# Patient Record
Sex: Male | Born: 1945 | Race: White | Hispanic: No | Marital: Married | Smoking: Never smoker
Health system: Southern US, Community
[De-identification: ages and names within clinical notes are randomized; demographics above are authoritative.]

## PROBLEM LIST (undated history)

## (undated) DIAGNOSIS — I951 Orthostatic hypotension: Secondary | ICD-10-CM

## (undated) DIAGNOSIS — M25519 Pain in unspecified shoulder: Secondary | ICD-10-CM

## (undated) DIAGNOSIS — R3129 Other microscopic hematuria: Secondary | ICD-10-CM

## (undated) DIAGNOSIS — M6200 Separation of muscle (nontraumatic), unspecified site: Secondary | ICD-10-CM

## (undated) DIAGNOSIS — L409 Psoriasis, unspecified: Secondary | ICD-10-CM

## (undated) DIAGNOSIS — R972 Elevated prostate specific antigen [PSA]: Secondary | ICD-10-CM

## (undated) DIAGNOSIS — I493 Ventricular premature depolarization: Secondary | ICD-10-CM

## (undated) DIAGNOSIS — E785 Hyperlipidemia, unspecified: Secondary | ICD-10-CM

## (undated) DIAGNOSIS — I10 Essential (primary) hypertension: Secondary | ICD-10-CM

## (undated) DIAGNOSIS — L719 Rosacea, unspecified: Secondary | ICD-10-CM

## (undated) DIAGNOSIS — E669 Obesity, unspecified: Secondary | ICD-10-CM

## (undated) DIAGNOSIS — N419 Inflammatory disease of prostate, unspecified: Secondary | ICD-10-CM

## (undated) DIAGNOSIS — I517 Cardiomegaly: Secondary | ICD-10-CM

## (undated) DIAGNOSIS — M109 Gout, unspecified: Secondary | ICD-10-CM

## (undated) DIAGNOSIS — R339 Retention of urine, unspecified: Secondary | ICD-10-CM

## (undated) DIAGNOSIS — N400 Enlarged prostate without lower urinary tract symptoms: Secondary | ICD-10-CM

## (undated) DIAGNOSIS — E782 Mixed hyperlipidemia: Secondary | ICD-10-CM

## (undated) DIAGNOSIS — N2889 Other specified disorders of kidney and ureter: Secondary | ICD-10-CM

## (undated) DIAGNOSIS — N138 Other obstructive and reflux uropathy: Secondary | ICD-10-CM

## (undated) HISTORY — DX: Separation of muscle (nontraumatic), unspecified site: M62.00

## (undated) HISTORY — DX: Other microscopic hematuria: R31.29

## (undated) HISTORY — DX: Pain in unspecified shoulder: M25.519

## (undated) HISTORY — DX: Inflammatory disease of prostate, unspecified: N41.9

## (undated) HISTORY — PX: OTHER SURGICAL HISTORY: SHX169

## (undated) HISTORY — DX: Rosacea, unspecified: L71.9

## (undated) HISTORY — DX: Benign prostatic hyperplasia without lower urinary tract symptoms: N40.0

## (undated) HISTORY — DX: Essential (primary) hypertension: I10

## (undated) HISTORY — DX: Obesity, unspecified: E66.9

## (undated) HISTORY — PX: COLONOSCOPY WITH PROPOFOL: SHX5780

## (undated) HISTORY — DX: Retention of urine, unspecified: R33.9

## (undated) HISTORY — DX: Hyperlipidemia, unspecified: E78.5

## (undated) HISTORY — DX: Other specified disorders of kidney and ureter: N28.89

## (undated) HISTORY — DX: Gout, unspecified: M10.9

## (undated) HISTORY — DX: Psoriasis, unspecified: L40.9

## (undated) HISTORY — PX: HYDROCELE EXCISION: SHX482

---

## 2011-02-27 DIAGNOSIS — M25519 Pain in unspecified shoulder: Secondary | ICD-10-CM | POA: Insufficient documentation

## 2011-02-27 DIAGNOSIS — L408 Other psoriasis: Secondary | ICD-10-CM | POA: Insufficient documentation

## 2011-02-27 DIAGNOSIS — M109 Gout, unspecified: Secondary | ICD-10-CM | POA: Insufficient documentation

## 2011-02-27 DIAGNOSIS — N419 Inflammatory disease of prostate, unspecified: Secondary | ICD-10-CM | POA: Insufficient documentation

## 2011-02-27 DIAGNOSIS — M6200 Separation of muscle (nontraumatic), unspecified site: Secondary | ICD-10-CM | POA: Insufficient documentation

## 2011-02-27 DIAGNOSIS — E782 Mixed hyperlipidemia: Secondary | ICD-10-CM | POA: Insufficient documentation

## 2011-02-27 DIAGNOSIS — L719 Rosacea, unspecified: Secondary | ICD-10-CM | POA: Insufficient documentation

## 2011-02-27 DIAGNOSIS — I10 Essential (primary) hypertension: Secondary | ICD-10-CM | POA: Insufficient documentation

## 2013-01-30 ENCOUNTER — Ambulatory Visit: Payer: Self-pay | Admitting: Gastroenterology

## 2013-02-02 LAB — PATHOLOGY REPORT

## 2013-08-06 HISTORY — PX: COLONOSCOPY WITH PROPOFOL: SHX5780

## 2013-11-04 ENCOUNTER — Ambulatory Visit: Payer: Self-pay | Admitting: Family Medicine

## 2014-11-16 ENCOUNTER — Emergency Department
Admit: 2014-11-16 | Disposition: A | Discharge status: Home / Self Care | Payer: Self-pay | Admitting: Emergency Medicine

## 2014-11-16 LAB — URINALYSIS, COMPLETE
Bilirubin,UR: NEGATIVE
Glucose,UR: NEGATIVE mg/dL (ref 0–75)
KETONE: NEGATIVE
Nitrite: NEGATIVE
PH: 6 (ref 4.5–8.0)
PROTEIN: NEGATIVE
SPECIFIC GRAVITY: 1.01 (ref 1.003–1.030)

## 2015-06-23 ENCOUNTER — Encounter: Payer: Self-pay | Admitting: *Deleted

## 2015-07-04 ENCOUNTER — Encounter: Payer: Self-pay | Admitting: Urology

## 2015-07-04 ENCOUNTER — Ambulatory Visit (INDEPENDENT_AMBULATORY_CARE_PROVIDER_SITE_OTHER): Payer: Medicare Other | Admitting: Urology

## 2015-07-04 VITALS — BP 129/67 | HR 52 | Ht 69.0 in | Wt 238.5 lb

## 2015-07-04 DIAGNOSIS — N401 Enlarged prostate with lower urinary tract symptoms: Secondary | ICD-10-CM

## 2015-07-04 DIAGNOSIS — Z87898 Personal history of other specified conditions: Secondary | ICD-10-CM

## 2015-07-04 DIAGNOSIS — N400 Enlarged prostate without lower urinary tract symptoms: Secondary | ICD-10-CM | POA: Insufficient documentation

## 2015-07-04 DIAGNOSIS — N289 Disorder of kidney and ureter, unspecified: Secondary | ICD-10-CM | POA: Diagnosis not present

## 2015-07-04 DIAGNOSIS — R972 Elevated prostate specific antigen [PSA]: Secondary | ICD-10-CM

## 2015-07-04 DIAGNOSIS — N138 Other obstructive and reflux uropathy: Secondary | ICD-10-CM

## 2015-07-04 DIAGNOSIS — Z87448 Personal history of other diseases of urinary system: Secondary | ICD-10-CM | POA: Diagnosis not present

## 2015-07-04 LAB — URINALYSIS, COMPLETE
Bilirubin, UA: NEGATIVE
Glucose, UA: NEGATIVE
Ketones, UA: NEGATIVE
LEUKOCYTES UA: NEGATIVE
NITRITE UA: NEGATIVE
Protein, UA: NEGATIVE
RBC, UA: NEGATIVE
Specific Gravity, UA: 1.015 (ref 1.005–1.030)
Urobilinogen, Ur: 0.2 mg/dL (ref 0.2–1.0)
pH, UA: 6.5 (ref 5.0–7.5)

## 2015-07-04 LAB — MICROSCOPIC EXAMINATION: EPITHELIAL CELLS (NON RENAL): NONE SEEN /HPF (ref 0–10)

## 2015-07-04 NOTE — Progress Notes (Signed)
07/04/2015 9:09 AM   Eric Dickson. 1946/04/24 [id number]  Referring provider: No referring provider defined for this encounter.  Chief Complaint  Patient presents with  . Benign Prostatic Hypertrophy    6 month recheck    HPI: Patient is a 69 year old white male with a history of indeterminate renal masses, a history of acute urinary retention, elevated PSA and BPH with LUTS who presents today for 6 month follow-up.  Renal masses Patient was found to have a left benign renal cyst and a right renal lesion that was too small to characterize on an MRI performed June 2015.  History of acute urinary retention Patient had an episode of acute urinary retention precipitated by a UTI in April 2016.  He had been off his alpha blocker and finasteride for several years prior to his event. He has not any difficulty with urination since reinstating these medications.  Elevated PSA Patient's PSA was 6.0 on 12/27/2014. His PSA, free was 2.30. He was reinstated on his finasteride and he presents today for follow-up. PSA is drawn today.  BPH WITH LUTS His IPSS score today is 8, which is moderate lower urinary tract symptomatology. He is pleased with his quality life due to his urinary symptoms.  He denies any dysuria, hematuria or suprapubic pain.   He currently taking toxin cells and 4 mg daily and finasteride 5 mg daily.   He also denies any recent fevers, chills, nausea or vomiting.   He does not have a family history of PCa.      IPSS      07/04/15 0900       International Prostate Symptom Score   How often have you had the sensation of not emptying your bladder? Less than 1 in 5     How often have you had to urinate less than every two hours? Less than 1 in 5 times     How often have you found you stopped and started again several times when you urinated? Less than half the time     How often have you found it difficult to postpone urination? Not at All     How often have you  had a weak urinary stream? Less than 1 in 5 times     How often have you had to strain to start urination? Less than 1 in 5 times     How many times did you typically get up at night to urinate? 2 Times     Total IPSS Score 8     Quality of Life due to urinary symptoms   If you were to spend the rest of your life with your urinary condition just the way it is now how would you feel about that? Pleased        Score:  1-7 Mild 8-19 Moderate 20-35 Severe   PMH: Past Medical History  Diagnosis Date  . BPH (benign prostatic hypertrophy)   . Gout   . Shoulder pain   . Diastasis, muscle   . Psoriasis   . Microscopic hematuria   . Renal mass   . HLD (hyperlipidemia)   . Rosacea   . Prostatitis   . Urinary retention   . HTN (hypertension)   . Obesity     Surgical History: Past Surgical History  Procedure Laterality Date  . Hydrostent removal      Home Medications:    Medication List       This list is accurate as of: 07/04/15  9:09 AM.  Always use your most recent med list.               amLODipine 10 MG tablet  Commonly known as:  NORVASC  Take by mouth.     aspirin 81 MG tablet  Take by mouth.     clindamycin 1 % gel  Commonly known as:  CLINDAGEL  Apply topically.     doxazosin 4 MG tablet  Commonly known as:  CARDURA  TK 1 T PO ATN     finasteride 5 MG tablet  Commonly known as:  PROSCAR  Take by mouth.     fluticasone 50 MCG/ACT nasal spray  Commonly known as:  FLONASE  Place into the nose.     lisinopril 20 MG tablet  Commonly known as:  PRINIVIL,ZESTRIL  Take by mouth.     magnesium 30 MG tablet  Take 30 mg by mouth daily.     metoprolol 50 MG tablet  Commonly known as:  LOPRESSOR  Take by mouth.     MULTIVITAMIN ADULT PO  Take by mouth.     nystatin cream  Commonly known as:  MYCOSTATIN  Apply topically.     PROBIOTIC + OMEGA-3 PO  Take by mouth.     triamterene-hydrochlorothiazide 37.5-25 MG tablet  Commonly known as:   MAXZIDE-25  TK 1 T PO QD        Allergies: No Known Allergies  Family History: Family History  Problem Relation Age of Onset  . COPD Mother   . Heart disease Father   . Kidney disease Neg Hx   . Prostate cancer Neg Hx     Social History:  reports that he has never smoked. He does not have any smokeless tobacco history on file. He reports that he does not drink alcohol or use illicit drugs.  ROS: UROLOGY Frequent Urination?: No Hard to postpone urination?: No Burning/pain with urination?: No Get up at night to urinate?: Yes Leakage of urine?: No Urine stream starts and stops?: No Trouble starting stream?: No Do you have to strain to urinate?: No Blood in urine?: No Urinary tract infection?: No Sexually transmitted disease?: No Injury to kidneys or bladder?: No Painful intercourse?: No Weak stream?: No Erection problems?: No Penile pain?: No  Gastrointestinal Nausea?: No Vomiting?: No Indigestion/heartburn?: No Diarrhea?: No Constipation?: No  Constitutional Fever: No Night sweats?: No Weight loss?: No Fatigue?: No  Skin Skin rash/lesions?: No Itching?: Yes  Eyes Blurred vision?: No Double vision?: No  Ears/Nose/Throat Sore throat?: No Sinus problems?: No  Hematologic/Lymphatic Swollen glands?: No Easy bruising?: No  Cardiovascular Leg swelling?: No Chest pain?: No  Respiratory Cough?: No Shortness of breath?: No  Endocrine Excessive thirst?: No  Musculoskeletal Back pain?: No Joint pain?: No  Neurological Headaches?: No Dizziness?: No  Psychologic Depression?: No Anxiety?: No  Physical Exam: BP 129/67 mmHg  Pulse 52  Ht 5\' 9"  (1.753 m)  Wt 238 lb 8 oz (108.183 kg)  BMI 35.20 kg/m2  GU: No CVA tenderness.  No bladder fullness or masses.  Patient with circumcised phallus.  Urethral meatus is patent.  No penile discharge. No penile lesions or rashes. Scrotum without lesions, cysts, rashes and/or edema.  Testicles are located  scrotally bilaterally. No masses are appreciated in the testicles. Left and right epididymis are normal. Rectal: Patient with  normal sphincter tone. Anus and perineum without scarring or rashes. No rectal masses are appreciated. Prostate is approximately 55 grams, no nodules are appreciated. Seminal vesicles  are normal.   Laboratory Data: PSA history  6.0 ng/mL on 12/27/2014  PSA, free 2.30 ng/mL  Urinalysis Results for orders placed or performed in visit on 07/04/15  Microscopic Examination  Result Value Ref Range   WBC, UA 0-5 0 -  5 /hpf   RBC, UA 0-2 0 -  2 /hpf   Epithelial Cells (non renal) None seen 0 - 10 /hpf   Renal Epithel, UA 0-10 (A) None seen /hpf   Bacteria, UA Few None seen/Few  Urinalysis, Complete  Result Value Ref Range   Specific Gravity, UA 1.015 1.005 - 1.030   pH, UA 6.5 5.0 - 7.5   Color, UA Yellow Yellow   Appearance Ur Clear Clear   Leukocytes, UA Negative Negative   Protein, UA Negative Negative/Trace   Glucose, UA Negative Negative   Ketones, UA Negative Negative   RBC, UA Negative Negative   Bilirubin, UA Negative Negative   Urobilinogen, Ur 0.2 0.2 - 1.0 mg/dL   Nitrite, UA Negative Negative   Microscopic Examination See below:      Assessment & Plan:    1. Renal mass:   Patient with a too small to characterize right renal mass found on MRI performed 1 year ago.   I will schedule a renal ultrasound and contact him with the results by phone.   2. History of acute urinary retention:    Patient has had no further difficulty with urination once he was reinstated on his Cardura and Proscar.   He will continue these medications.   3. Elevated PSA:   Patient's PSA was found to be 6.0 ng/mL in May 2016.  Free, PSA indicated a 10% probability of prostate cancer.   He was initiated on finasteride 6 months ago.   PSA is drawn today and hopefully the value has declined.   4. BPH (benign prostatic hyperplasia) with LUTS:   IPSS score 8/1.  He will  continue the Cardura and Proscar. His primary care physician is filling the prescriptions. He'll return in 6 months for I PSS score, exam and PSA  - Urinalysis, Complete - PSA   Return in about 6 months (around 01/01/2016) for IPSS score and exam.  Zara Council, [facility]  [place] [address] [place], [place] [address] [phone]

## 2015-07-05 ENCOUNTER — Telehealth: Payer: Self-pay

## 2015-07-05 DIAGNOSIS — R972 Elevated prostate specific antigen [PSA]: Secondary | ICD-10-CM

## 2015-07-05 LAB — PSA: Prostate Specific Ag, Serum: 1.5 ng/mL (ref 0.0–4.0)

## 2015-07-05 NOTE — Telephone Encounter (Signed)
Spoke with pt in reference to PSA results. Pt voiced understanding.  

## 2015-07-05 NOTE — Telephone Encounter (Signed)
-----   Message from Nori Riis, PA-C sent at 07/05/2015  8:31 AM EST ----- Patient's PSA is stable.  We will see him in 12 months.  PSA to be drawn before his next appointment.

## 2015-07-12 ENCOUNTER — Ambulatory Visit
Admission: RE | Admit: 2015-07-12 | Discharge: 2015-07-12 | Disposition: A | Discharge status: Home / Self Care | Payer: Medicare Other | Source: Ambulatory Visit | Attending: Urology | Admitting: Urology

## 2015-07-12 DIAGNOSIS — R938 Abnormal findings on diagnostic imaging of other specified body structures: Secondary | ICD-10-CM | POA: Diagnosis not present

## 2015-07-12 DIAGNOSIS — N289 Disorder of kidney and ureter, unspecified: Secondary | ICD-10-CM | POA: Diagnosis present

## 2015-07-13 ENCOUNTER — Telehealth: Payer: Self-pay

## 2015-07-13 DIAGNOSIS — N289 Disorder of kidney and ureter, unspecified: Secondary | ICD-10-CM

## 2015-07-13 DIAGNOSIS — N2889 Other specified disorders of kidney and ureter: Secondary | ICD-10-CM

## 2015-07-13 NOTE — Telephone Encounter (Signed)
-----   Message from Nori Riis, PA-C sent at 07/12/2015  4:54 PM EST ----- You can tell the patient that his RUS was normal.  We will repeat it again in one year.

## 2015-07-13 NOTE — Telephone Encounter (Signed)
Spoke with pt in reference to RUS. Pt voiced understanding. Orders placed.

## 2015-12-07 ENCOUNTER — Other Ambulatory Visit: Payer: Self-pay | Admitting: Urology

## 2015-12-08 ENCOUNTER — Other Ambulatory Visit: Payer: Self-pay | Admitting: Urology

## 2015-12-27 ENCOUNTER — Other Ambulatory Visit: Payer: Medicare Other

## 2015-12-27 DIAGNOSIS — R972 Elevated prostate specific antigen [PSA]: Secondary | ICD-10-CM

## 2015-12-28 LAB — PSA: PROSTATE SPECIFIC AG, SERUM: 1 ng/mL (ref 0.0–4.0)

## 2016-01-03 ENCOUNTER — Encounter: Payer: Self-pay | Admitting: Urology

## 2016-01-03 ENCOUNTER — Ambulatory Visit (INDEPENDENT_AMBULATORY_CARE_PROVIDER_SITE_OTHER): Payer: Medicare Other | Admitting: Urology

## 2016-01-03 VITALS — BP 119/67 | HR 55 | Ht 69.0 in | Wt 240.8 lb

## 2016-01-03 DIAGNOSIS — N2889 Other specified disorders of kidney and ureter: Secondary | ICD-10-CM

## 2016-01-03 DIAGNOSIS — Z87448 Personal history of other diseases of urinary system: Secondary | ICD-10-CM

## 2016-01-03 DIAGNOSIS — Z87898 Personal history of other specified conditions: Secondary | ICD-10-CM | POA: Diagnosis not present

## 2016-01-03 DIAGNOSIS — N138 Other obstructive and reflux uropathy: Secondary | ICD-10-CM

## 2016-01-03 DIAGNOSIS — N401 Enlarged prostate with lower urinary tract symptoms: Secondary | ICD-10-CM | POA: Diagnosis not present

## 2016-01-03 MED ORDER — FINASTERIDE 5 MG PO TABS: 5.0000 mg | ORAL_TABLET | Freq: Every day | ORAL | Status: DC

## 2016-01-03 NOTE — Progress Notes (Signed)
11:24 AM   Eric Dickson. 1946-05-21 [id number]  Referring provider: [name], MD [address] [place], [place] [address]  Chief Complaint  Patient presents with  . Benign Prostatic Hypertrophy    6 mos follow up   . Hematuria    microscopic    HPI: Patient is a 70 year old Caucasian male with indeterminate renal masses, a history of acute urinary retention, a history of elevated PSA and BPH with LUTS who presents today for 6 month follow-up.  Renal masses Patient was found to have a left benign renal cyst and a right renal lesion that was too small to characterize on an MRI performed June 2015.  RUS completed in 07/12/2015 did not identify a discrete mass.    History of acute urinary retention Patient had an episode of acute urinary retention precipitated by a UTI in April 2016.  He had been off his alpha blocker and finasteride for several years prior to his event. He has not any difficulty with urination since reinstating these medications.    History of elevated PSA Patient's PSA was 6.0 on 12/27/2014. His PSA, free was 2.30. He was reinstated on his finasteride.   His most recent PSA was 1.0 ng/mL on 12/27/2015.    BPH WITH LUTS His IPSS score today is 7, which is mild lower urinary tract symptomatology. He is delighted with his quality life due to his urinary symptoms.  His previous IPSS score is 8/1.   He denies any dysuria, hematuria or suprapubic pain.   He currently taking doxazosin 4 mg daily and finasteride 5 mg daily.   He also denies any recent fevers, chills, nausea or vomiting.   He does not have a family history of PCa.      IPSS      01/03/16 0900       International Prostate Symptom Score   How often have you had the sensation of not emptying your bladder? Less than 1 in 5     How often have you had to urinate less than every two hours? About half the time     How often have you found you stopped and started again several times  when you urinated? Not at All     How often have you found it difficult to postpone urination? Not at All     How often have you had a weak urinary stream? Less than 1 in 5 times     How often have you had to strain to start urination? Less than 1 in 5 times     How many times did you typically get up at night to urinate? 1 Time     Total IPSS Score 7     Quality of Life due to urinary symptoms   If you were to spend the rest of your life with your urinary condition just the way it is now how would you feel about that? Delighted        Score:  1-7 Mild 8-19 Moderate 20-35 Severe   PMH: Past Medical History  Diagnosis Date  . BPH (benign prostatic hypertrophy)   . Gout   . Shoulder pain   . Diastasis, muscle   . Psoriasis   . Microscopic hematuria   . Renal mass   . HLD (hyperlipidemia)   . Rosacea   . Prostatitis   . Urinary retention   . HTN (hypertension)   . Obesity     Surgical History: Past  Surgical History  Procedure Laterality Date  . Hydrostent removal      Home Medications:    Medication List       This list is accurate as of: 01/03/16 11:24 AM.  Always use your most recent med list.               amLODipine 10 MG tablet  Commonly known as:  NORVASC  Take by mouth.     aspirin 81 MG tablet  Take by mouth.     atorvastatin 20 MG tablet  Commonly known as:  LIPITOR  Take by mouth.     clindamycin 1 % gel  Commonly known as:  CLINDAGEL  Apply topically.     doxazosin 4 MG tablet  Commonly known as:  CARDURA  TK 1 T PO ATN     finasteride 5 MG tablet  Commonly known as:  PROSCAR  Take 1 tablet (5 mg total) by mouth daily.     FISH OIL + D3 1000-1000 MG-UNIT Caps  Take by mouth.     fluticasone 50 MCG/ACT nasal spray  Commonly known as:  FLONASE  Place into the nose. Reported on 01/03/2016     lisinopril 20 MG tablet  Commonly known as:  PRINIVIL,ZESTRIL  Take by mouth.     magnesium 30 MG tablet  Take 30 mg by mouth daily.       magnesium oxide 400 MG tablet  Commonly known as:  MAG-OX  Take by mouth. Reported on 01/03/2016     metoprolol 50 MG tablet  Commonly known as:  LOPRESSOR  Take by mouth.     MULTIVITAMIN ADULT PO  Take by mouth.     PROBIOTIC + OMEGA-3 PO  Take by mouth. Reported on 01/03/2016     triamterene-hydrochlorothiazide 37.5-25 MG tablet  Commonly known as:  MAXZIDE-25  TK 1 T PO QD        Allergies: No Known Allergies  Family History: Family History  Problem Relation Age of Onset  . COPD Mother   . Heart disease Father   . Kidney disease Neg Hx   . Prostate cancer Neg Hx     Social History:  reports that he has never smoked. He does not have any smokeless tobacco history on file. He reports that he does not drink alcohol or use illicit drugs.  ROS: UROLOGY Frequent Urination?: No Hard to postpone urination?: No Burning/pain with urination?: No Get up at night to urinate?: No Leakage of urine?: No Urine stream starts and stops?: No Trouble starting stream?: No Do you have to strain to urinate?: No Blood in urine?: No Urinary tract infection?: No Sexually transmitted disease?: No Injury to kidneys or bladder?: No Painful intercourse?: No Weak stream?: No Erection problems?: No Penile pain?: No  Gastrointestinal Nausea?: No Vomiting?: No Indigestion/heartburn?: No Diarrhea?: No Constipation?: No  Constitutional Fever: No Night sweats?: No Weight loss?: No Fatigue?: No  Skin Skin rash/lesions?: No Itching?: No  Eyes Blurred vision?: No Double vision?: No  Ears/Nose/Throat Sore throat?: No Sinus problems?: No  Hematologic/Lymphatic Swollen glands?: No Easy bruising?: No  Cardiovascular Leg swelling?: No Chest pain?: No  Respiratory Cough?: No Shortness of breath?: No  Endocrine Excessive thirst?: No  Musculoskeletal Back pain?: No Joint pain?: No  Neurological Headaches?: No Dizziness?: No  Psychologic Depression?:  No Anxiety?: No  Physical Exam: BP 119/67 mmHg  Pulse 55  Ht 5\' 9"  (1.753 m)  Wt 240 lb 12.8 oz (109.226 kg)  BMI  35.54 kg/m2  GU: No CVA tenderness.  No bladder fullness or masses.  Patient with circumcised phallus.  Urethral meatus is patent.  No penile discharge. No penile lesions or rashes. Scrotum without lesions, cysts, rashes and/or edema.  Testicles are located scrotally bilaterally. No masses are appreciated in the testicles. Left and right epididymis are normal. Rectal: Patient with  normal sphincter tone. Anus and perineum without scarring or rashes. No rectal masses are appreciated. Prostate is approximately 55 grams, no nodules are appreciated. Seminal vesicles are normal.   Laboratory Data: PSA history  6.0 ng/mL on 12/27/2014  PSA, free 2.30 ng/mL  1.5 ng/mL on 07/04/2015  1.0 ng/mL on 12/27/2015      Assessment & Plan:    1. Renal mass:   Patient with a too small to characterize right renal mass found on MRI performed 1 year ago.   I will schedule a renal ultrasound and contact him with the results by phone.   2. History of acute urinary retention:    Patient has had no further difficulty with urination once he was reinstated on his Cardura and Proscar.   He will discontinue the Cardura at this time and continue the finasteride 5 mg daily.  3. History of elevated PSA:   Patient's PSA was found to be 6.0 ng/mL in May 2016.  Free, PSA indicated a 10% probability of prostate cancer.   He was initiated on finasteride 12 months ago.   Recent PSA was 1.0 ng/mL on 12/27/2015.  We will see him in one year for IPSS score, exam and PSA.    4. BPH (benign prostatic hyperplasia) with LUTS:   IPSS score 7/0.  He will discontinue the Cardura and continue the Proscar.   He'll return in 12 months for I PSS score, exam and PSA   Return in about 1 year (around 01/02/2017) for IPSS, PSA and exam.  Zara Council, [facility]  [place] [address] [place],  [address] [phone]

## 2016-01-23 ENCOUNTER — Ambulatory Visit
Admission: RE | Admit: 2016-01-23 | Discharge: 2016-01-23 | Disposition: A | Discharge status: Home / Self Care | Payer: Medicare Other | Source: Ambulatory Visit | Attending: Urology | Admitting: Urology

## 2016-01-23 DIAGNOSIS — K760 Fatty (change of) liver, not elsewhere classified: Secondary | ICD-10-CM | POA: Diagnosis not present

## 2016-01-23 DIAGNOSIS — N2889 Other specified disorders of kidney and ureter: Secondary | ICD-10-CM | POA: Insufficient documentation

## 2016-01-26 ENCOUNTER — Other Ambulatory Visit: Payer: Self-pay | Admitting: Urology

## 2016-01-26 DIAGNOSIS — N2889 Other specified disorders of kidney and ureter: Secondary | ICD-10-CM

## 2016-01-27 ENCOUNTER — Telehealth: Payer: Self-pay | Admitting: Urology

## 2016-01-27 NOTE — Telephone Encounter (Signed)
Ok I will take care of it. I have made the follow up appt and I have left him a message to call me back   michelle

## 2016-01-27 NOTE — Telephone Encounter (Signed)
Are you going to call him with the results?

## 2016-01-27 NOTE — Telephone Encounter (Signed)
I would like him to have the appointment in 6 months in case the RUS results show bad pathology.  If I receive the results before his appointment and it is benign, I will most likely call him with the results.

## 2016-01-27 NOTE — Telephone Encounter (Signed)
Patient notified of this appt.

## 2016-04-03 ENCOUNTER — Encounter: Payer: Self-pay | Admitting: Urology

## 2016-04-03 ENCOUNTER — Ambulatory Visit (INDEPENDENT_AMBULATORY_CARE_PROVIDER_SITE_OTHER): Payer: Medicare Other | Admitting: Urology

## 2016-04-03 VITALS — BP 167/69 | HR 60 | Ht 69.0 in | Wt 239.2 lb

## 2016-04-03 DIAGNOSIS — N400 Enlarged prostate without lower urinary tract symptoms: Secondary | ICD-10-CM

## 2016-04-03 DIAGNOSIS — R339 Retention of urine, unspecified: Secondary | ICD-10-CM

## 2016-04-03 LAB — MICROSCOPIC EXAMINATION: BACTERIA UA: NONE SEEN

## 2016-04-03 LAB — URINALYSIS, COMPLETE
BILIRUBIN UA: NEGATIVE
Glucose, UA: NEGATIVE
Ketones, UA: NEGATIVE
Leukocytes, UA: NEGATIVE
Nitrite, UA: NEGATIVE
PH UA: 7 (ref 5.0–7.5)
Protein, UA: NEGATIVE
Specific Gravity, UA: 1.02 (ref 1.005–1.030)
UUROB: 0.2 mg/dL (ref 0.2–1.0)

## 2016-04-03 LAB — BLADDER SCAN AMB NON-IMAGING: Scan Result: 105

## 2016-04-03 MED ORDER — TAMSULOSIN HCL 0.4 MG PO CAPS: 0.4000 mg | ORAL_CAPSULE | Freq: Every day | ORAL | 11 refills | Status: DC

## 2016-04-03 NOTE — Progress Notes (Signed)
04/03/2016 11:41 AM   Eric Dickson. 06-05-1946 [id number]  Referring provider: [name], MD [address] [place], [place] [address]  Chief Complaint  Patient presents with  . Other    Trouble Urinating    HPI: Eric Dickson is a 70yo here for worsening LUTS. He feels that as the day progresses he has difficulty with weak stream and incomplete emptying for the past 2 weeks. Intermittent dysuria. Nocturia 1x.  No other associated symptoms. No He had a hx of UTI with urinary retention 1 year ago. He is currently on finasteride 5mg  daily. He was previously on Cardura but stopped that medication over 1 year ago.  IPSS is 7 with a bother of 4.  PVR is 105cc.  He has a daily morning bowel movement which is mostly soft.   PMH: Past Medical History:  Diagnosis Date  . BPH (benign prostatic hypertrophy)   . Diastasis, muscle   . Gout   . HLD (hyperlipidemia)   . HTN (hypertension)   . Microscopic hematuria   . Obesity   . Prostatitis   . Psoriasis   . Renal mass   . Rosacea   . Shoulder pain   . Urinary retention     Surgical History: Past Surgical History:  Procedure Laterality Date  . hydrostent removal      Home Medications:    Medication List       Accurate as of 04/03/16 11:41 AM. Always use your most recent med list.          amLODipine 10 MG tablet Commonly known as:  NORVASC Take by mouth.   aspirin 81 MG tablet Take by mouth.   atorvastatin 20 MG tablet Commonly known as:  LIPITOR Take by mouth.   clindamycin 1 % gel Commonly known as:  CLINDAGEL Apply topically.   doxazosin 4 MG tablet Commonly known as:  CARDURA TK 1 T PO ATN   finasteride 5 MG tablet Commonly known as:  PROSCAR Take 1 tablet (5 mg total) by mouth daily.   FISH OIL + D3 1000-1000 MG-UNIT Caps Take by mouth.   fluticasone 50 MCG/ACT nasal spray Commonly known as:  FLONASE Place into the nose. Reported on 01/03/2016   lisinopril 20 MG  tablet Commonly known as:  PRINIVIL,ZESTRIL Take by mouth.   magnesium 30 MG tablet Take 30 mg by mouth daily.   magnesium oxide 400 MG tablet Commonly known as:  MAG-OX Take by mouth. Reported on 01/03/2016   metoprolol 50 MG tablet Commonly known as:  LOPRESSOR Take by mouth.   MULTIVITAMIN ADULT PO Take by mouth.   PROBIOTIC + OMEGA-3 PO Take by mouth. Reported on 01/03/2016   triamterene-hydrochlorothiazide 37.5-25 MG tablet Commonly known as:  MAXZIDE-25 TK 1 T PO QD       Allergies: No Known Allergies  Family History: Family History  Problem Relation Age of Onset  . COPD Mother   . Heart disease Father   . Kidney disease Neg Hx   . Prostate cancer Neg Hx     Social History:  reports that he has never smoked. He does not have any smokeless tobacco history on file. He reports that he does not drink alcohol or use drugs.  ROS: UROLOGY Frequent Urination?: No Hard to postpone urination?: No Burning/pain with urination?: Yes Get up at night to urinate?: No Leakage of urine?: No Urine stream starts and stops?: Yes Trouble starting stream?: No Do you have to strain to  urinate?: Yes Blood in urine?: No Urinary tract infection?: No Sexually transmitted disease?: No Injury to kidneys or bladder?: No Painful intercourse?: No Weak stream?: No Erection problems?: No Penile pain?: No  Gastrointestinal Nausea?: No Vomiting?: No Indigestion/heartburn?: Yes Diarrhea?: No Constipation?: No  Constitutional Fever: No Night sweats?: No Weight loss?: No Fatigue?: No  Skin Skin rash/lesions?: No Itching?: No  Eyes Blurred vision?: No Double vision?: No  Ears/Nose/Throat Sore throat?: No Sinus problems?: No  Hematologic/Lymphatic Swollen glands?: No Easy bruising?: No  Cardiovascular Leg swelling?: No Chest pain?: No  Respiratory Cough?: No Shortness of breath?: No  Endocrine Excessive thirst?: No  Musculoskeletal Back pain?: Yes Joint  pain?: No  Neurological Headaches?: No Dizziness?: No  Psychologic Depression?: No Anxiety?: No  Physical Exam: BP (!) 167/69   Pulse 60   Ht 5\' 9"  (1.753 m)   Wt 108.5 kg (239 lb 3.2 oz)   BMI 35.32 kg/m   Constitutional:  Alert and oriented, No acute distress. HEENT: [place] AT, moist mucus membranes.  Trachea midline, no masses. Cardiovascular: No clubbing, cyanosis, or edema. Respiratory: Normal respiratory effort, no increased work of breathing. GI: Abdomen is soft, nontender, nondistended, no abdominal masses GU: No CVA tenderness. Uncircumcised phallus. No masses/lesions on penis, scrotum, testes. Prostate 80g smooth, no nodules, no induration. Normal rectal tone. Skin: No rashes, bruises or suspicious lesions. Lymph: No cervical or inguinal adenopathy. Neurologic: Grossly intact, no focal deficits, moving all 4 extremities. Psychiatric: Normal mood and affect.  Laboratory Data: No results found for: WBC, HGB, HCT, MCV, PLT  No results found for: CREATININE  No results found for: PSA  No results found for: TESTOSTERONE  No results found for: HGBA1C  Urinalysis    Component Value Date/Time   COLORURINE YELLOW 11/16/2014 1530   APPEARANCEUR Clear 07/04/2015 0848   LABSPEC 1.010 11/16/2014 1530   PHURINE 6.0 11/16/2014 1530   GLUCOSEU Negative 07/04/2015 0848   GLUCOSEU NEGATIVE 11/16/2014 1530   HGBUR TRACE 11/16/2014 1530   BILIRUBINUR Negative 07/04/2015 0848   BILIRUBINUR NEGATIVE 11/16/2014 [place] 11/16/2014 1530   PROTEINUR Negative 07/04/2015 0848   PROTEINUR NEGATIVE 11/16/2014 1530   NITRITE Negative 07/04/2015 0848   NITRITE NEGATIVE 11/16/2014 1530   LEUKOCYTESUR Negative 07/04/2015 0848   LEUKOCYTESUR 3+ 11/16/2014 1530    Pertinent Imaging: none  Assessment & Plan:    1. Incomplete bladder emptying -flomax 0.4mg  daily -continue finasteride 5mg   - CULTURE, URINE COMPREHENSIVE - BLADDER SCAN AMB NON-IMAGING -  Urinalysis, Complete   No Follow-up on file.  Nicolette Bang, MD  [facility] [address], [place], [place] [address] [phone]

## 2016-05-08 ENCOUNTER — Ambulatory Visit (INDEPENDENT_AMBULATORY_CARE_PROVIDER_SITE_OTHER): Payer: Medicare Other | Admitting: Urology

## 2016-05-08 ENCOUNTER — Encounter: Payer: Self-pay | Admitting: Urology

## 2016-05-08 VITALS — BP 135/69 | HR 50 | Ht 69.0 in | Wt 239.3 lb

## 2016-05-08 DIAGNOSIS — R339 Retention of urine, unspecified: Secondary | ICD-10-CM | POA: Diagnosis not present

## 2016-05-08 DIAGNOSIS — N400 Enlarged prostate without lower urinary tract symptoms: Secondary | ICD-10-CM | POA: Diagnosis not present

## 2016-05-08 DIAGNOSIS — N2889 Other specified disorders of kidney and ureter: Secondary | ICD-10-CM | POA: Diagnosis not present

## 2016-05-08 LAB — BLADDER SCAN AMB NON-IMAGING: Scan Result: 39

## 2016-05-08 NOTE — Progress Notes (Signed)
05/08/2016 9:20 AM   Eric Dickson. 1946/04/09 [id number]  Referring provider: [name], MD [address] [place], [place] [address]  Chief Complaint  Patient presents with  . Follow-up    BPH,     HPI: Eric Dickson is a 70yo seen today for followup for BPH and ledt renal mass. He was previously on Cardura and finasteride but the cardura was stopped in 12/2015. He was then started on flomax which he relieves has significantly improved his nocturia 0-1x, frequency q 4-5 hours.   He underwent Renal US in 01/2016 which shiwed a 2.7cm left lower pole renal mass that is stable compared renal US 6 months prior   PMH: Past Medical History:  Diagnosis Date  . BPH (benign prostatic hypertrophy)   . Diastasis, muscle   . Gout   . HLD (hyperlipidemia)   . HTN (hypertension)   . Microscopic hematuria   . Obesity   . Prostatitis   . Psoriasis   . Renal mass   . Rosacea   . Shoulder pain   . Urinary retention     Surgical History: Past Surgical History:  Procedure Laterality Date  . hydrostent removal      Home Medications:    Medication List       Accurate as of 05/08/16  9:20 AM. Always use your most recent med list.          amLODipine 10 MG tablet Commonly known as:  NORVASC Take by mouth.   amLODipine 10 MG tablet Commonly known as:  NORVASC Take by mouth.   aspirin 81 MG tablet Take by mouth.   atorvastatin 20 MG tablet Commonly known as:  LIPITOR Take by mouth.   doxazosin 4 MG tablet Commonly known as:  CARDURA TK 1 T PO ATN   finasteride 5 MG tablet Commonly known as:  PROSCAR Take 1 tablet (5 mg total) by mouth daily.   FISH OIL + D3 1000-1000 MG-UNIT Caps Take by mouth.   fluticasone 50 MCG/ACT nasal spray Commonly known as:  FLONASE Place into the nose. Reported on 01/03/2016   lisinopril 20 MG tablet Commonly known as:  PRINIVIL,ZESTRIL Take by mouth.   lisinopril 20 MG tablet Commonly known as:   PRINIVIL,ZESTRIL Take by mouth.   magnesium 30 MG tablet Take 30 mg by mouth daily.   magnesium oxide 400 MG tablet Commonly known as:  MAG-OX Take by mouth. Reported on 01/03/2016   metoprolol 50 MG tablet Commonly known as:  LOPRESSOR Take by mouth.   metoprolol 50 MG tablet Commonly known as:  LOPRESSOR Take by mouth.   MULTIVITAMIN ADULT PO Take by mouth.   PROBIOTIC + OMEGA-3 PO Take by mouth. Reported on 01/03/2016   tamsulosin 0.4 MG Caps capsule Commonly known as:  FLOMAX Take 1 capsule (0.4 mg total) by mouth daily after supper.   triamterene-hydrochlorothiazide 37.5-25 MG tablet Commonly known as:  MAXZIDE-25 TK 1 T PO QD       Allergies: No Known Allergies  Family History: Family History  Problem Relation Age of Onset  . COPD Mother   . Heart disease Father   . Kidney disease Neg Hx   . Prostate cancer Neg Hx     Social History:  reports that he has never smoked. He does not have any smokeless tobacco history on file. He reports that he does not drink alcohol or use drugs.  ROS: UROLOGY Frequent Urination?: No Hard to postpone urination?: No Burning/pain  with urination?: No Get up at night to urinate?: No Leakage of urine?: No Urine stream starts and stops?: Yes Trouble starting stream?: No Do you have to strain to urinate?: No Blood in urine?: No Urinary tract infection?: No Sexually transmitted disease?: No Injury to kidneys or bladder?: No Painful intercourse?: No Weak stream?: No Erection problems?: No Penile pain?: No  Gastrointestinal Nausea?: No Vomiting?: No Indigestion/heartburn?: No Diarrhea?: No Constipation?: No  Constitutional Fever: No Night sweats?: No Weight loss?: No Fatigue?: No  Skin Skin rash/lesions?: No Itching?: No  Eyes Blurred vision?: No Double vision?: No  Ears/Nose/Throat Sore throat?: No Sinus problems?: No  Hematologic/Lymphatic Swollen glands?: No Easy bruising?:  No  Cardiovascular Leg swelling?: No Chest pain?: No  Respiratory Cough?: No Shortness of breath?: No  Endocrine Excessive thirst?: No  Musculoskeletal Back pain?: No Joint pain?: No  Neurological Headaches?: No Dizziness?: No  Psychologic Depression?: No Anxiety?: No  Physical Exam: BP 135/69   Pulse (!) 50   Ht 5\' 9"  (1.753 m)   Wt 108.5 kg (239 lb 4.8 oz)   BMI 35.34 kg/m   Constitutional:  Alert and oriented, No acute distress. HEENT: [place] AT, moist mucus membranes.  Trachea midline, no masses. Cardiovascular: No clubbing, cyanosis, or edema. Respiratory: Normal respiratory effort, no increased work of breathing. GI: Abdomen is soft, nontender, nondistended, no abdominal masses GU: No CVA tenderness.  Skin: No rashes, bruises or suspicious lesions. Lymph: No cervical or inguinal adenopathy. Neurologic: Grossly intact, no focal deficits, moving all 4 extremities. Psychiatric: Normal mood and affect.  Laboratory Data: No results found for: WBC, HGB, HCT, MCV, PLT  No results found for: CREATININE  No results found for: PSA  No results found for: TESTOSTERONE  No results found for: HGBA1C  Urinalysis    Component Value Date/Time   COLORURINE YELLOW 11/16/2014 1530   APPEARANCEUR Clear 04/03/2016 1139   LABSPEC 1.010 11/16/2014 1530   PHURINE 6.0 11/16/2014 1530   GLUCOSEU Negative 04/03/2016 1139   GLUCOSEU NEGATIVE 11/16/2014 1530   HGBUR TRACE 11/16/2014 1530   BILIRUBINUR Negative 04/03/2016 1139   BILIRUBINUR NEGATIVE 11/16/2014 [place] 11/16/2014 1530   PROTEINUR Negative 04/03/2016 [place] 11/16/2014 1530   NITRITE Negative 04/03/2016 1139   NITRITE NEGATIVE 11/16/2014 1530   LEUKOCYTESUR Negative 04/03/2016 1139   LEUKOCYTESUR 3+ 11/16/2014 1530    Pertinent Imaging: Renal US  Assessment & Plan:    1. Benign prostatic hyperplasia without lower urinary tract symptoms -continue finasteride and  flomax - Bladder Scan (Post Void Residual) in office -RTC 6 months   2. Incomplete bladder emptying -PVR today was good - Bladder Scan (Post Void Residual) in office  3. Renal mass -RTC 6 months with renal US   No Follow-up on file.  Nicolette Bang, MD  [facility] [address], [place], [place] [address] [phone]

## 2016-07-20 ENCOUNTER — Ambulatory Visit: Payer: Medicare Other | Admitting: Urology

## 2016-07-23 ENCOUNTER — Ambulatory Visit
Admission: RE | Admit: 2016-07-23 | Discharge: 2016-07-23 | Disposition: A | Discharge status: Home / Self Care | Payer: Medicare Other | Source: Ambulatory Visit | Attending: Urology | Admitting: Urology

## 2016-07-23 DIAGNOSIS — N2889 Other specified disorders of kidney and ureter: Secondary | ICD-10-CM | POA: Insufficient documentation

## 2016-07-24 ENCOUNTER — Telehealth: Payer: Self-pay

## 2016-07-24 DIAGNOSIS — N2889 Other specified disorders of kidney and ureter: Secondary | ICD-10-CM

## 2016-07-24 NOTE — Telephone Encounter (Signed)
-----   Message from Nori Riis, PA-C sent at 07/23/2016  1:08 PM EST ----- Please notify the patient that the area in the left kidney has not changed significantly.  He may undergo a MRI of his kidneys for further clarification or continue with RUS every six months for surveillance.

## 2016-07-24 NOTE — Telephone Encounter (Signed)
Spoke with pt in reference to RUS results. Made pt aware can have a MRI or continue RUS. Pt elected to continue RUS. Orders placed.

## 2016-10-03 ENCOUNTER — Other Ambulatory Visit: Payer: Self-pay | Admitting: Urology

## 2016-10-03 DIAGNOSIS — N401 Enlarged prostate with lower urinary tract symptoms: Secondary | ICD-10-CM

## 2016-10-30 DIAGNOSIS — N401 Enlarged prostate with lower urinary tract symptoms: Secondary | ICD-10-CM | POA: Insufficient documentation

## 2016-10-30 DIAGNOSIS — R351 Nocturia: Secondary | ICD-10-CM

## 2016-10-30 DIAGNOSIS — L708 Other acne: Secondary | ICD-10-CM | POA: Insufficient documentation

## 2016-10-30 DIAGNOSIS — Z23 Encounter for immunization: Secondary | ICD-10-CM | POA: Insufficient documentation

## 2016-12-26 ENCOUNTER — Other Ambulatory Visit: Payer: Medicare Other

## 2017-01-01 ENCOUNTER — Other Ambulatory Visit: Payer: Medicare Other

## 2017-01-02 ENCOUNTER — Other Ambulatory Visit
Admission: RE | Admit: 2017-01-02 | Discharge: 2017-01-02 | Disposition: A | Discharge status: Home / Self Care | Payer: Medicare Other | Source: Ambulatory Visit | Attending: Urology | Admitting: Urology

## 2017-01-02 ENCOUNTER — Other Ambulatory Visit: Payer: Self-pay | Admitting: *Deleted

## 2017-01-02 ENCOUNTER — Ambulatory Visit: Payer: Medicare Other | Admitting: Urology

## 2017-01-02 DIAGNOSIS — R972 Elevated prostate specific antigen [PSA]: Secondary | ICD-10-CM | POA: Diagnosis present

## 2017-01-02 NOTE — Progress Notes (Signed)
01/04/2017 11:32 AM   Eric Dickson. 10-05-1945 [id number]  Referring provider: [name], [place], [place] [address]  Chief Complaint  Patient presents with  . Benign Prostatic Hypertrophy    1 year follow up     HPI: 71 yo WM with BPH with LU TS and left renal mass who presents for a yearly visit.  BPH WITH LUTS His IPSS score today is 11, which is moderate lower urinary tract symptomatology. He is mostly satisfied with his quality life due to his urinary symptoms. His PVR is 93 mL.  His major complaint today is nocturia x 1.  He has had these symptoms forever.   He states that over the last year, his nocturia has reduced from 3 to 1 times a night.  He denies any dysuria, hematuria or suprapubic pain.   He currently taking tamsulosin 0.4 mg daily and finasteride 5 mg daily.    He also denies any recent fevers, chills, nausea or vomiting.     IPSS    Row Name 01/04/17 1100         International Prostate Symptom Score   How often have you had the sensation of not emptying your bladder? Less than half the time     How often have you had to urinate less than every two hours? About half the time     How often have you found you stopped and started again several times when you urinated? Less than half the time     How often have you found it difficult to postpone urination? Less than 1 in 5 times     How often have you had a weak urinary stream? Less than 1 in 5 times     How often have you had to strain to start urination? Less than 1 in 5 times     How many times did you typically get up at night to urinate? 1 Time     Total IPSS Score 11       Quality of Life due to urinary symptoms   If you were to spend the rest of your life with your urinary condition just the way it is now how would you feel about that? Mostly Satisfied        Score:  1-7 Mild 8-19 Moderate 20-35 Severe   Renal mass RUS performed on 07/23/2016 noted a  rounded mass versus prominent dromedary hump measuring 2.4 x 1.5 x 2.2 cm in the left kidney.  He has not had any flank pain or hematuria.     PMH: Past Medical History:  Diagnosis Date  . BPH (benign prostatic hypertrophy)   . Diastasis, muscle   . Gout   . HLD (hyperlipidemia)   . HTN (hypertension)   . Microscopic hematuria   . Obesity   . Prostatitis   . Psoriasis   . Renal mass   . Rosacea   . Shoulder pain   . Urinary retention     Surgical History: Past Surgical History:  Procedure Laterality Date  . hydrostent removal      Home Medications:  Allergies as of 01/04/2017   No Known Allergies     Medication List       Accurate as of 01/04/17 11:32 AM. Always use your most recent med list.          amLODipine 10 MG tablet Commonly known as:  NORVASC Take by mouth.   amLODipine 10 MG  tablet Commonly known as:  NORVASC Take by mouth.   aspirin 81 MG tablet Take by mouth.   atorvastatin 20 MG tablet Commonly known as:  LIPITOR Take by mouth.   clindamycin 1 % gel Commonly known as:  CLINDAGEL Apply topically.   doxazosin 4 MG tablet Commonly known as:  CARDURA TK 1 T PO ATN   finasteride 5 MG tablet Commonly known as:  PROSCAR TAKE (1) TABLET BY MOUTH EVERY DAY   FISH OIL + D3 1000-1000 MG-UNIT Caps Take by mouth.   fluticasone 50 MCG/ACT nasal spray Commonly known as:  FLONASE Place into the nose. Reported on 01/03/2016   lisinopril 20 MG tablet Commonly known as:  PRINIVIL,ZESTRIL Take by mouth.   lisinopril 20 MG tablet Commonly known as:  PRINIVIL,ZESTRIL Take by mouth.   magnesium 30 MG tablet Take 30 mg by mouth daily.   magnesium oxide 400 MG tablet Commonly known as:  MAG-OX Take by mouth. Reported on 01/03/2016   metoprolol tartrate 50 MG tablet Commonly known as:  LOPRESSOR Take by mouth.   metoprolol tartrate 50 MG tablet Commonly known as:  LOPRESSOR Take by mouth.   MULTIVITAMIN ADULT PO Take by mouth.     MULTIVITAMIN PO Take by mouth.   PROBIOTIC + OMEGA-3 PO Take by mouth. Reported on 01/03/2016   tamsulosin 0.4 MG Caps capsule Commonly known as:  FLOMAX Take 1 capsule (0.4 mg total) by mouth daily after supper.   triamterene-hydrochlorothiazide 37.5-25 MG tablet Commonly known as:  MAXZIDE-25 TK 1 T PO QD   Zoster Vac Recomb Adjuvanted injection Commonly known as:  SHINGRIX 1 dose today, repeat 4-6 months       Allergies: No Known Allergies  Family History: Family History  Problem Relation Age of Onset  . COPD Mother   . Heart disease Father   . Kidney disease Neg Hx   . Prostate cancer Neg Hx   . Kidney cancer Neg Hx   . Bladder Cancer Neg Hx     Social History:  reports that he has never smoked. He quit smokeless tobacco use about 25 years ago. His smokeless tobacco use included Chew. He reports that he does not drink alcohol or use drugs.  ROS: UROLOGY Frequent Urination?: No Hard to postpone urination?: No Burning/pain with urination?: No Get up at night to urinate?: Yes Leakage of urine?: No Urine stream starts and stops?: No Trouble starting stream?: No Do you have to strain to urinate?: No Blood in urine?: No Urinary tract infection?: No Sexually transmitted disease?: No Injury to kidneys or bladder?: No Painful intercourse?: No Weak stream?: No Erection problems?: No Penile pain?: No  Gastrointestinal Nausea?: No Vomiting?: No Indigestion/heartburn?: No Diarrhea?: No Constipation?: No  Constitutional Fever: No Night sweats?: No Weight loss?: No Fatigue?: No  Skin Skin rash/lesions?: No Itching?: No  Eyes Blurred vision?: No Double vision?: No  Ears/Nose/Throat Sore throat?: No Sinus problems?: No  Hematologic/Lymphatic Swollen glands?: No Easy bruising?: No  Cardiovascular Leg swelling?: No Chest pain?: No  Respiratory Cough?: No Shortness of breath?: No  Endocrine Excessive thirst?: No  Musculoskeletal Back  pain?: Yes Joint pain?: No  Neurological Headaches?: No Dizziness?: No  Psychologic Depression?: No Anxiety?: No  Physical Exam: BP (!) 147/69   Pulse (!) 52   Ht 5\' 9"  (1.753 m)   Wt 209 lb (94.8 kg)   BMI 30.86 kg/m   Constitutional: Well nourished. Alert and oriented, No acute distress. HEENT: [place] AT, moist mucus membranes.  Trachea midline, no masses. Cardiovascular: No clubbing, cyanosis, or edema. Respiratory: Normal respiratory effort, no increased work of breathing. GI: Abdomen is soft, non tender, non distended, no abdominal masses. Liver and spleen not palpable.  No hernias appreciated.  Stool sample for occult testing is not indicated.   GU: No CVA tenderness.  No bladder fullness or masses.  Patient with circumcised phallus.  Urethral meatus is patent.  No penile discharge. No penile lesions or rashes. Scrotum without lesions, cysts, rashes and/or edema.  Testicles are located scrotally bilaterally. No masses are appreciated in the testicles. Left and right epididymis are normal. Rectal: Patient with  normal sphincter tone. Anus and perineum without scarring or rashes. No rectal masses are appreciated. Prostate is approximately 45 grams, no nodules are appreciated. Seminal vesicles are normal. Skin: No rashes, bruises or suspicious lesions. Lymph: No cervical or inguinal adenopathy. Neurologic: Grossly intact, no focal deficits, moving all 4 extremities. Psychiatric: Normal mood and affect.  Laboratory Data: PSA History    1.5 ng/mL on 07/04/2015  1.0 ng/mL on 12/27/2015  0.79 ng/mL on 01/02/2017   Assessment & Plan:    1. BPH with LUTS  - IPSS score is 11/2  - Continue conservative management, avoiding bladder irritants and timed voiding's  - most bothersome symptoms is/are nocturia  - Continue tamsulosin 0.4 mg daily and finasteride 5 mg daily; refills given  - RTC in 12 months for IPSS, PSA and exam   2. Left renal mass  - unchanged at 07/2016 RUS  -  will schedule follow up RUS at this time - call with results    Return in about 1 year (around 01/04/2018) for  IPSS, PSA , RUS and exam.  Zara Council, [facility]  [facility] [address] [place],  [address] [phone]

## 2017-01-03 LAB — PSA: PSA: 0.79 ng/mL (ref 0.00–4.00)

## 2017-01-04 ENCOUNTER — Encounter: Payer: Self-pay | Admitting: Urology

## 2017-01-04 ENCOUNTER — Ambulatory Visit (INDEPENDENT_AMBULATORY_CARE_PROVIDER_SITE_OTHER): Payer: Medicare Other | Admitting: Urology

## 2017-01-04 VITALS — BP 147/69 | HR 52 | Ht 69.0 in | Wt 209.0 lb

## 2017-01-04 DIAGNOSIS — N138 Other obstructive and reflux uropathy: Secondary | ICD-10-CM | POA: Diagnosis not present

## 2017-01-04 DIAGNOSIS — N401 Enlarged prostate with lower urinary tract symptoms: Secondary | ICD-10-CM | POA: Diagnosis not present

## 2017-01-04 DIAGNOSIS — N2889 Other specified disorders of kidney and ureter: Secondary | ICD-10-CM | POA: Diagnosis not present

## 2017-01-04 LAB — BLADDER SCAN AMB NON-IMAGING: Scan Result: 93

## 2017-01-04 MED ORDER — FINASTERIDE 5 MG PO TABS: ORAL_TABLET | ORAL | 1 refills | Status: DC

## 2017-01-04 MED ORDER — TAMSULOSIN HCL 0.4 MG PO CAPS: 0.4000 mg | ORAL_CAPSULE | Freq: Every day | ORAL | 3 refills | Status: DC

## 2017-01-14 ENCOUNTER — Ambulatory Visit
Admission: RE | Admit: 2017-01-14 | Discharge: 2017-01-14 | Disposition: A | Discharge status: Home / Self Care | Payer: Medicare Other | Source: Ambulatory Visit | Attending: Urology | Admitting: Urology

## 2017-01-14 DIAGNOSIS — N2889 Other specified disorders of kidney and ureter: Secondary | ICD-10-CM | POA: Insufficient documentation

## 2017-01-21 ENCOUNTER — Ambulatory Visit: Payer: Medicare Other

## 2017-01-21 ENCOUNTER — Telehealth: Payer: Self-pay

## 2017-01-21 NOTE — Telephone Encounter (Signed)
Called patient. Gave RUS results. Patient verbalized understanding.

## 2017-01-21 NOTE — Telephone Encounter (Signed)
-----   Message from Nori Riis, PA-C sent at 01/21/2017  7:58 AM EDT ----- Please let Mr. Stfleur know that his RUS was normal.  We do not need to continue the ultrasounds at this time.

## 2017-05-14 DIAGNOSIS — I517 Cardiomegaly: Secondary | ICD-10-CM | POA: Insufficient documentation

## 2017-12-25 ENCOUNTER — Other Ambulatory Visit: Payer: Self-pay

## 2017-12-25 ENCOUNTER — Other Ambulatory Visit: Payer: Self-pay | Admitting: Urology

## 2017-12-25 DIAGNOSIS — N401 Enlarged prostate with lower urinary tract symptoms: Secondary | ICD-10-CM

## 2017-12-25 MED ORDER — FINASTERIDE 5 MG PO TABS: ORAL_TABLET | ORAL | 0 refills | Status: DC

## 2017-12-27 NOTE — Telephone Encounter (Signed)
Patient called the office today about his Rx for finasteride.  It was sent to the wrong pharmacy.  He would like the Rx for FINASTERIDE and TAMSULOSIN to be sent to the [facility] in [place], not [facility].  Please contact him with any questions or concerns.

## 2018-01-02 NOTE — Progress Notes (Deleted)
01/03/2018 9:04 AM   Eric Dickson. [birth year] [id number]  Referring provider: [name], [place], [place] [address]  No chief complaint on file.   HPI: 72 yo WM with BPH with LU TS and left renal mass who presents for a yearly visit.  BPH WITH LUTS His IPSS score today is ***, which is *** lower urinary tract symptomatology. He is *** with his quality life due to his urinary symptoms. His PVR is 93 mL.  His previous I PSS score was 11/2.  His previous PVR was 93 mL.  His major complaint today *** is nocturia x 1.  He has had these symptoms forever.   He states that over the last year, his nocturia has reduced from 3 to 1 times a night.  He denies any dysuria, hematuria or suprapubic pain.   He currently taking tamsulosin 0.4 mg daily and finasteride 5 mg daily.    He also denies any recent fevers, chills, nausea or vomiting.   Score:  1-7 Mild 8-19 Moderate 20-35 Severe   Renal mass RUS performed on 07/23/2016 noted a rounded mass versus prominent dromedary hump measuring 2.4 x 1.5 x 2.2 cm in the left kidney.   RUS on 01/14/2017 noted no left renal mass is observed. The kidneys are normal in appearance. No acute abnormality of the urinary bladder is demonstrated.     PMH: Past Medical History:  Diagnosis Date  . BPH (benign prostatic hypertrophy)   . Diastasis, muscle   . Gout   . HLD (hyperlipidemia)   . HTN (hypertension)   . Microscopic hematuria   . Obesity   . Prostatitis   . Psoriasis   . Renal mass   . Rosacea   . Shoulder pain   . Urinary retention     Surgical History: Past Surgical History:  Procedure Laterality Date  . hydrostent removal      Home Medications:  Allergies as of 01/03/2018   No Known Allergies     Medication List        Accurate as of 01/02/18  9:04 AM. Always use your most recent med list.          amLODipine 10 MG tablet Commonly known as:  NORVASC Take by mouth.   amLODipine 10  MG tablet Commonly known as:  NORVASC Take by mouth.   aspirin 81 MG tablet Take by mouth.   atorvastatin 20 MG tablet Commonly known as:  LIPITOR Take by mouth.   doxazosin 4 MG tablet Commonly known as:  CARDURA TK 1 T PO ATN   finasteride 5 MG tablet Commonly known as:  PROSCAR TAKE (1) TABLET BY MOUTH EVERY DAY   finasteride 5 MG tablet Commonly known as:  PROSCAR TAKE (1) TABLET BY MOUTH EVERY DAY   FISH OIL + D3 1000-1000 MG-UNIT Caps Take by mouth.   fluticasone 50 MCG/ACT nasal spray Commonly known as:  FLONASE Place into the nose. Reported on 01/03/2016   lisinopril 20 MG tablet Commonly known as:  PRINIVIL,ZESTRIL Take by mouth.   lisinopril 20 MG tablet Commonly known as:  PRINIVIL,ZESTRIL Take by mouth.   magnesium 30 MG tablet Take 30 mg by mouth daily.   magnesium oxide 400 MG tablet Commonly known as:  MAG-OX Take by mouth. Reported on 01/03/2016   metoprolol tartrate 50 MG tablet Commonly known as:  LOPRESSOR Take by mouth.   metoprolol tartrate 50 MG tablet Commonly known as:  LOPRESSOR Take  by mouth.   MULTIVITAMIN ADULT PO Take by mouth.   MULTIVITAMIN PO Take by mouth.   PROBIOTIC + OMEGA-3 PO Take by mouth. Reported on 01/03/2016   tamsulosin 0.4 MG Caps capsule Commonly known as:  FLOMAX Take 1 capsule (0.4 mg total) by mouth daily after supper.   triamterene-hydrochlorothiazide 37.5-25 MG tablet Commonly known as:  MAXZIDE-25 TK 1 T PO QD   Zoster Vaccine Adjuvanted injection Commonly known as:  SHINGRIX 1 dose today, repeat 4-6 months       Allergies: No Known Allergies  Family History: Family History  Problem Relation Age of Onset  . COPD Mother   . Heart disease Father   . Kidney disease Neg Hx   . Prostate cancer Neg Hx   . Kidney cancer Neg Hx   . Bladder Cancer Neg Hx     Social History:  reports that he has never smoked. He quit smokeless tobacco use about 26 years ago. His smokeless tobacco use  included chew. He reports that he does not drink alcohol or use drugs.  ROS:                                        Physical Exam: There were no vitals taken for this visit.  Constitutional: Well nourished. Alert and oriented, No acute distress. HEENT: [place] AT, moist mucus membranes. Trachea midline, no masses. Cardiovascular: No clubbing, cyanosis, or edema. Respiratory: Normal respiratory effort, no increased work of breathing. GI: Abdomen is soft, non tender, non distended, no abdominal masses. Liver and spleen not palpable.  No hernias appreciated.  Stool sample for occult testing is not indicated.   GU: No CVA tenderness.  No bladder fullness or masses.  Patient with circumcised phallus.  Urethral meatus is patent.  No penile discharge. No penile lesions or rashes. Scrotum without lesions, cysts, rashes and/or edema.  Testicles are located scrotally bilaterally. No masses are appreciated in the testicles. Left and right epididymis are normal. Rectal: Patient with  normal sphincter tone. Anus and perineum without scarring or rashes. No rectal masses are appreciated. Prostate is approximately 45 grams, no nodules are appreciated. Seminal vesicles are normal. Skin: No rashes, bruises or suspicious lesions. Lymph: No cervical or inguinal adenopathy. Neurologic: Grossly intact, no focal deficits, moving all 4 extremities. Psychiatric: Normal mood and affect. ***  Constitutional: Well nourished. Alert and oriented, No acute distress. HEENT: [place] AT, moist mucus membranes. Trachea midline, no masses. Cardiovascular: No clubbing, cyanosis, or edema. Respiratory: Normal respiratory effort, no increased work of breathing. GI: Abdomen is soft, non tender, non distended, no abdominal masses. Liver and spleen not palpable.  No hernias appreciated.  Stool sample for occult testing is not indicated.   GU: No CVA tenderness.  No bladder fullness or masses.  Patient with  circumcised/uncircumcised phallus. ***Foreskin easily retracted***  Urethral meatus is patent.  No penile discharge. No penile lesions or rashes. Scrotum without lesions, cysts, rashes and/or edema.  Testicles are located scrotally bilaterally. No masses are appreciated in the testicles. Left and right epididymis are normal. Rectal: Patient with  normal sphincter tone. Anus and perineum without scarring or rashes. No rectal masses are appreciated. Prostate is approximately *** grams, *** nodules are appreciated. Seminal vesicles are normal. Skin: No rashes, bruises or suspicious lesions. Lymph: No cervical or inguinal adenopathy. Neurologic: Grossly intact, no focal deficits, moving all 4 extremities. Psychiatric: Normal mood  and affect.   Laboratory Data: PSA History    1.5 ng/mL on 07/04/2015  1.0 ng/mL on 12/27/2015  0.79 ng/mL on 01/02/2017  I have reviewed the labs.  Pertinent Imaging CLINICAL DATA:  Follow-up left renal mass versus dromedary hump  EXAM: RENAL / URINARY TRACT ULTRASOUND COMPLETE  COMPARISON:  Ultrasound of July 23, 2016  FINDINGS: Right Kidney:  Length: 12.8 cm. Echogenicity within normal limits. No mass or hydronephrosis visualized.  Left Kidney:  Length: 13.3 cm. The renal cortical echotexture is normal. No suspicious mass is observed. There is no hydronephrosis.  Bladder:  Appears normal for degree of bladder distention. Bilateral ureteral jets are demonstrated. There is mild enlargement of the prostate gland to 5.2 cm transversely.  IMPRESSION: No left renal mass is observed. The kidneys are normal in appearance. No acute abnormality of the urinary bladder is demonstrated.   Electronically Signed   By: David  Martinique M.D.   On: 01/14/2017 10:05   Assessment & Plan:    1. BPH with LUTS  - IPSS score is 11/2  - Continue conservative management, avoiding bladder irritants and timed voiding's  - most bothersome symptoms  is/are nocturia  - Continue tamsulosin 0.4 mg daily and finasteride 5 mg daily; refills given  - RTC in 12 months for IPSS, PSA and exam - if today's PSA is stable   2. Left renal mass No masses seen on RUS in 01/2017   No follow-ups on file.  Eric Council, PA-C  [facility] [address] [place], [place] [address] [phone]

## 2018-01-03 ENCOUNTER — Ambulatory Visit: Payer: Medicare Other | Admitting: Urology

## 2018-04-29 ENCOUNTER — Emergency Department: Payer: Medicare Other

## 2018-04-29 ENCOUNTER — Encounter: Payer: Self-pay | Admitting: Emergency Medicine

## 2018-04-29 ENCOUNTER — Observation Stay
Admission: EM | Admit: 2018-04-29 | Discharge: 2018-04-30 | Disposition: A | Discharge status: Home / Self Care | Payer: Medicare Other | Attending: Internal Medicine | Admitting: Internal Medicine

## 2018-04-29 ENCOUNTER — Other Ambulatory Visit: Payer: Self-pay

## 2018-04-29 DIAGNOSIS — E860 Dehydration: Secondary | ICD-10-CM | POA: Insufficient documentation

## 2018-04-29 DIAGNOSIS — Z23 Encounter for immunization: Secondary | ICD-10-CM | POA: Insufficient documentation

## 2018-04-29 DIAGNOSIS — I10 Essential (primary) hypertension: Secondary | ICD-10-CM | POA: Insufficient documentation

## 2018-04-29 DIAGNOSIS — Z825 Family history of asthma and other chronic lower respiratory diseases: Secondary | ICD-10-CM | POA: Diagnosis not present

## 2018-04-29 DIAGNOSIS — Z8249 Family history of ischemic heart disease and other diseases of the circulatory system: Secondary | ICD-10-CM | POA: Diagnosis not present

## 2018-04-29 DIAGNOSIS — R338 Other retention of urine: Secondary | ICD-10-CM | POA: Insufficient documentation

## 2018-04-29 DIAGNOSIS — R778 Other specified abnormalities of plasma proteins: Secondary | ICD-10-CM

## 2018-04-29 DIAGNOSIS — Z6831 Body mass index (BMI) 31.0-31.9, adult: Secondary | ICD-10-CM | POA: Insufficient documentation

## 2018-04-29 DIAGNOSIS — N179 Acute kidney failure, unspecified: Secondary | ICD-10-CM | POA: Insufficient documentation

## 2018-04-29 DIAGNOSIS — E785 Hyperlipidemia, unspecified: Secondary | ICD-10-CM | POA: Insufficient documentation

## 2018-04-29 DIAGNOSIS — M6200 Separation of muscle (nontraumatic), unspecified site: Secondary | ICD-10-CM | POA: Insufficient documentation

## 2018-04-29 DIAGNOSIS — Z87891 Personal history of nicotine dependence: Secondary | ICD-10-CM | POA: Insufficient documentation

## 2018-04-29 DIAGNOSIS — E669 Obesity, unspecified: Secondary | ICD-10-CM | POA: Diagnosis not present

## 2018-04-29 DIAGNOSIS — N401 Enlarged prostate with lower urinary tract symptoms: Secondary | ICD-10-CM | POA: Insufficient documentation

## 2018-04-29 DIAGNOSIS — M109 Gout, unspecified: Secondary | ICD-10-CM | POA: Insufficient documentation

## 2018-04-29 DIAGNOSIS — L719 Rosacea, unspecified: Secondary | ICD-10-CM | POA: Diagnosis not present

## 2018-04-29 DIAGNOSIS — R55 Syncope and collapse: Principal | ICD-10-CM | POA: Diagnosis present

## 2018-04-29 DIAGNOSIS — I491 Atrial premature depolarization: Secondary | ICD-10-CM | POA: Diagnosis not present

## 2018-04-29 DIAGNOSIS — R3914 Feeling of incomplete bladder emptying: Secondary | ICD-10-CM | POA: Diagnosis not present

## 2018-04-29 DIAGNOSIS — E119 Type 2 diabetes mellitus without complications: Secondary | ICD-10-CM | POA: Diagnosis not present

## 2018-04-29 DIAGNOSIS — R748 Abnormal levels of other serum enzymes: Secondary | ICD-10-CM | POA: Insufficient documentation

## 2018-04-29 DIAGNOSIS — Z79899 Other long term (current) drug therapy: Secondary | ICD-10-CM | POA: Insufficient documentation

## 2018-04-29 DIAGNOSIS — N419 Inflammatory disease of prostate, unspecified: Secondary | ICD-10-CM | POA: Insufficient documentation

## 2018-04-29 DIAGNOSIS — R7989 Other specified abnormal findings of blood chemistry: Secondary | ICD-10-CM

## 2018-04-29 LAB — BASIC METABOLIC PANEL
Anion gap: 9 (ref 5–15)
BUN: 28 mg/dL — ABNORMAL HIGH (ref 8–23)
CO2: 23 mmol/L (ref 22–32)
Calcium: 9.1 mg/dL (ref 8.9–10.3)
Chloride: 105 mmol/L (ref 98–111)
Creatinine, Ser: 1.23 mg/dL (ref 0.61–1.24)
GFR calc Af Amer: >60 mL/min (ref >60)
GFR, EST NON AFRICAN AMERICAN: 57 mL/min — AB (ref >60)
GLUCOSE: 120 mg/dL — AB (ref 70–99)
POTASSIUM: 4 mmol/L (ref 3.5–5.1)
Sodium: 137 mmol/L (ref 135–145)

## 2018-04-29 LAB — URINALYSIS, COMPLETE (UACMP) WITH MICROSCOPIC
BACTERIA UA: NONE SEEN
BILIRUBIN URINE: NEGATIVE
Glucose, UA: NEGATIVE mg/dL
Hgb urine dipstick: NEGATIVE
KETONES UR: 20 mg/dL — AB
LEUKOCYTES UA: NEGATIVE
Nitrite: NEGATIVE
Protein, ur: NEGATIVE mg/dL
Specific Gravity, Urine: 1.018 (ref 1.005–1.030)
Squamous Epithelial / LPF: NONE SEEN (ref 0–5)
pH: 5 (ref 5.0–8.0)

## 2018-04-29 LAB — CBC
HEMATOCRIT: 44.7 % (ref 40.0–52.0)
HEMOGLOBIN: 15.5 g/dL (ref 13.0–18.0)
MCH: 29.4 pg (ref 26.0–34.0)
MCHC: 34.6 g/dL (ref 32.0–36.0)
MCV: 84.9 fL (ref 80.0–100.0)
Platelets: 282 10*3/uL (ref 150–440)
RBC: 5.27 MIL/uL (ref 4.40–5.90)
RDW: 13.4 % (ref 11.5–14.5)
WBC: 10.4 10*3/uL (ref 3.8–10.6)

## 2018-04-29 LAB — TROPONIN I
TROPONIN I: 0.09 ng/mL — AB (ref <0.03)
Troponin I: 0.08 ng/mL (ref <0.03)

## 2018-04-29 MED ORDER — ONDANSETRON HCL 4 MG PO TABS: 4.0000 mg | ORAL_TABLET | Freq: Four times a day (QID) | ORAL | Status: DC | PRN

## 2018-04-29 MED ORDER — INFLUENZA VAC SPLIT HIGH-DOSE 0.5 ML IM SUSY
0.5000 mL | PREFILLED_SYRINGE | INTRAMUSCULAR | Status: DC
  Filled 2018-04-29: qty 0.5

## 2018-04-29 MED ORDER — ACETAMINOPHEN 650 MG RE SUPP: 650.0000 mg | Freq: Four times a day (QID) | RECTAL | Status: DC | PRN

## 2018-04-29 MED ORDER — AMLODIPINE BESYLATE 10 MG PO TABS
10.0000 mg | ORAL_TABLET | Freq: Every day | ORAL | Status: DC
  Administered 2018-04-30: 10 mg via ORAL
  Filled 2018-04-29: qty 1

## 2018-04-29 MED ORDER — ONDANSETRON HCL 4 MG/2ML IJ SOLN: 4.0000 mg | Freq: Four times a day (QID) | INTRAMUSCULAR | Status: DC | PRN

## 2018-04-29 MED ORDER — FISH OIL + D3 1000-1000 MG-UNIT PO CAPS: 1.0000 | ORAL_CAPSULE | Freq: Every day | ORAL | Status: DC

## 2018-04-29 MED ORDER — SODIUM CHLORIDE 0.9 % IV BOLUS
1000.0000 mL | Freq: Once | INTRAVENOUS | Status: AC
  Administered 2018-04-29: 1000 mL via INTRAVENOUS

## 2018-04-29 MED ORDER — ASPIRIN 81 MG PO CHEW
CHEWABLE_TABLET | ORAL | Status: AC
  Filled 2018-04-29: qty 4

## 2018-04-29 MED ORDER — FAMOTIDINE 20 MG PO TABS
20.0000 mg | ORAL_TABLET | Freq: Two times a day (BID) | ORAL | Status: DC
  Administered 2018-04-29 – 2018-04-30 (×2): 20 mg via ORAL
  Filled 2018-04-29 (×2): qty 1

## 2018-04-29 MED ORDER — ENOXAPARIN SODIUM 40 MG/0.4ML ~~LOC~~ SOLN
40.0000 mg | SUBCUTANEOUS | Status: DC
  Administered 2018-04-29: 40 mg via SUBCUTANEOUS
  Filled 2018-04-29: qty 0.4

## 2018-04-29 MED ORDER — SODIUM CHLORIDE 0.9 % IV SOLN
INTRAVENOUS | Status: DC
  Administered 2018-04-29: 21:00:00 via INTRAVENOUS

## 2018-04-29 MED ORDER — LISINOPRIL 10 MG PO TABS
10.0000 mg | ORAL_TABLET | Freq: Every day | ORAL | Status: DC
  Administered 2018-04-30: 10 mg via ORAL
  Filled 2018-04-29 (×2): qty 1

## 2018-04-29 MED ORDER — MAGNESIUM OXIDE 400 (241.3 MG) MG PO TABS
400.0000 mg | ORAL_TABLET | Freq: Every day | ORAL | Status: DC
  Administered 2018-04-30: 400 mg via ORAL
  Filled 2018-04-29: qty 1

## 2018-04-29 MED ORDER — FINASTERIDE 5 MG PO TABS
5.0000 mg | ORAL_TABLET | Freq: Every day | ORAL | Status: DC
  Administered 2018-04-30: 5 mg via ORAL
  Filled 2018-04-29: qty 1

## 2018-04-29 MED ORDER — VITAMIN D3 25 MCG (1000 UNIT) PO TABS
1000.0000 [IU] | ORAL_TABLET | Freq: Every day | ORAL | Status: DC
  Administered 2018-04-30: 1000 [IU] via ORAL
  Filled 2018-04-29: qty 1

## 2018-04-29 MED ORDER — ACETAMINOPHEN 325 MG PO TABS: 650.0000 mg | ORAL_TABLET | Freq: Four times a day (QID) | ORAL | Status: DC | PRN

## 2018-04-29 MED ORDER — IPRATROPIUM BROMIDE 0.06 % NA SOLN
2.0000 | Freq: Three times a day (TID) | NASAL | Status: DC
  Administered 2018-04-29 – 2018-04-30 (×2): 2 via NASAL
  Filled 2018-04-29: qty 15

## 2018-04-29 MED ORDER — OMEGA-3-ACID ETHYL ESTERS 1 G PO CAPS
1.0000 g | ORAL_CAPSULE | Freq: Two times a day (BID) | ORAL | Status: DC
  Administered 2018-04-30: 1 g via ORAL
  Filled 2018-04-29: qty 1

## 2018-04-29 MED ORDER — TRAZODONE HCL 50 MG PO TABS
50.0000 mg | ORAL_TABLET | Freq: Every evening | ORAL | Status: DC | PRN
  Administered 2018-04-29: 50 mg via ORAL
  Filled 2018-04-29: qty 1

## 2018-04-29 MED ORDER — ASPIRIN 81 MG PO CHEW
324.0000 mg | CHEWABLE_TABLET | Freq: Once | ORAL | Status: AC
  Administered 2018-04-29: 324 mg via ORAL
  Filled 2018-04-29: qty 4

## 2018-04-29 MED ORDER — ATORVASTATIN CALCIUM 20 MG PO TABS
20.0000 mg | ORAL_TABLET | Freq: Every day | ORAL | Status: DC
  Administered 2018-04-30: 20 mg via ORAL
  Filled 2018-04-29: qty 1

## 2018-04-29 NOTE — ED Notes (Signed)
Pt up to toilet. Pt ambulatory with no difficulty

## 2018-04-29 NOTE — ED Notes (Signed)
Date and time results received: 04/29/18 1715 (use smartphrase ".now" to insert current time)  Test: troponin Critical Value: 0.09  Name of Provider Notified: Paduchowski MD  Orders Received? Or Actions Taken?: Orders Received - See Orders for details

## 2018-04-29 NOTE — Progress Notes (Signed)
Patient ID: Eric Rod., male   DOB: [birth year], 72 y.o.   MRN: [id number]  ACP note  Patient and wife at bedside  Diagnosis: Near syncope, elevated borderline troponin, acute kidney injury, hypertension, BPH, hyperlipidemia  CODE STATUS discussed and patient wishes to be a full code.  Plan.  Observe overnight on telemetry.  Serial cardiac enzymes.  Check orthostatic vital signs.  Give IV fluid bolus and IV fluid hydration.  Hold Flomax  Time spent on ACP discussion 17 minutes Dr. Loletha Grayer

## 2018-04-29 NOTE — ED Notes (Signed)
Patient transported to X-ray 

## 2018-04-29 NOTE — ED Notes (Signed)
Attempted to call report. Per Remo Lipps, sec stated they are changing the room number, this RN will call back in 5 min.

## 2018-04-29 NOTE — ED Notes (Signed)
Pt back from X-ray.  

## 2018-04-29 NOTE — H&P (Signed)
[facility] NAME: Eric Dickson    MR#:  [id number]  DATE OF BIRTH:  [birth year]  DATE OF ADMISSION:  04/29/2018  PRIMARY CARE PHYSICIAN: [name], MD   REQUESTING/REFERRING PHYSICIAN: Dr. Harvest Dark  CHIEF COMPLAINT:   Chief Complaint  Patient presents with  . Dizziness  . Hypertension    HISTORY OF PRESENT ILLNESS:  Eric Dickson  is a 72 y.o. male with a known history of near syncope in the past.  He states that he has had occasional dizzy spells over the last year.  Dr. Nehemiah Massed has been adjusting his medications.  Normally he catches himself and waits a few seconds holding onto something and then it passes.  Of note about 2 weeks ago he has been belching and having a lot of postnasal drip and allergies.  Today while walking he had 3 episodes of near syncope that lasted about 15 seconds each.  He states he just gets a little lightheaded and has to wait until it passes.  He did not pass out.  Things were not spinning on him.  He thought he made an appointment with Dr. Kym Groom on the [facility] chart by Dr. Kym Groom was admitted him today.  His office referred him to the emergency room.  In the ER he was found to have a borderline troponin of 0.09.  No complaints of chest pain or shortness of breath.  Hospitalist services were contacted for evaluation.  PAST MEDICAL HISTORY:   Past Medical History:  Diagnosis Date  . BPH (benign prostatic hypertrophy)   . Diastasis, muscle   . Gout   . HLD (hyperlipidemia)   . HTN (hypertension)   . Microscopic hematuria   . Obesity   . Prostatitis   . Psoriasis   . Renal mass   . Rosacea   . Shoulder pain   . Urinary retention     PAST SURGICAL HISTORY:   Past Surgical History:  Procedure Laterality Date  . hydrostent removal      SOCIAL HISTORY:   Social History   Tobacco Use  . Smoking status: Never Smoker  . Smokeless tobacco: Former Systems developer     Types: Chew  Substance Use Topics  . Alcohol use: No    Alcohol/week: 0.0 standard drinks    FAMILY HISTORY:   Family History  Problem Relation Age of Onset  . COPD Mother   . Heart disease Father   . Kidney disease Neg Hx   . Prostate cancer Neg Hx   . Kidney cancer Neg Hx   . Bladder Cancer Neg Hx     DRUG ALLERGIES:  No Known Allergies  REVIEW OF SYSTEMS:  CONSTITUTIONAL: No fever, chills or sweats.  Positive for weight loss over the last few weeks.  Decreased appetite over the last few weeks. EYES: No blurred or double vision.  Wears glasses EARS, NOSE, AND THROAT: No tinnitus or ear pain. No sore throat.  Positive for postnasal drip RESPIRATORY: Slight cough.  No shortness of breath, wheezing or hemoptysis.  CARDIOVASCULAR: No chest pain, orthopnea, edema.  GASTROINTESTINAL: No nausea, vomiting, diarrhea or abdominal pain. No blood in bowel movements GENITOURINARY: No dysuria, hematuria.  ENDOCRINE: No polyuria, nocturia,  HEMATOLOGY: No anemia, easy bruising or bleeding SKIN: No rash or lesion. MUSCULOSKELETAL: Some joint pains in the morning NEUROLOGIC: No tingling, numbness, weakness.  PSYCHIATRY: No anxiety or depression.   MEDICATIONS AT HOME:   Prior to Admission medications  Medication Sig Start Date End Date Taking? Authorizing Provider  amLODipine (NORVASC) 10 MG tablet Take 10 mg by mouth daily.  05/02/15 04/29/18 Yes [provider]  atorvastatin (LIPITOR) 20 MG tablet Take 20 mg by mouth daily.  10/31/15 04/29/18 Yes [provider]  finasteride (PROSCAR) 5 MG tablet TAKE (1) TABLET BY MOUTH EVERY DAY 12/25/17  Yes McGowan, Larene Beach A, PA-C  Fish Oil-Cholecalciferol (FISH OIL + D3) 1000-1000 MG-UNIT CAPS Take 1 capsule by mouth daily.    Yes [provider]  ipratropium (ATROVENT) 0.06 % nasal spray Place 2 sprays into the nose 3 (three) times daily. 04/15/18  Yes [provider]  lisinopril (PRINIVIL,ZESTRIL) 20 MG tablet  Take 20 mg by mouth daily.  04/19/16 04/29/18 Yes [provider]  magnesium oxide (MAG-OX) 400 MG tablet Take 400 mg by mouth daily. Reported on 01/03/2016   Yes [provider]  Multiple Vitamins-Minerals (MULTIVITAMIN PO) Take 1 tablet by mouth daily.  12/21/08  Yes [provider]  tamsulosin (FLOMAX) 0.4 MG CAPS capsule Take 1 capsule (0.4 mg total) by mouth daily after supper. 01/04/17  Yes McGowan, Larene Beach A, PA-C  triamterene-hydrochlorothiazide (MAXZIDE-25) 37.5-25 MG tablet Take 1 tablet by mouth daily.  05/02/15  Yes [provider]      VITAL SIGNS:  Blood pressure (!) 157/80, pulse 96, temperature 98.4 F (36.9 C), temperature source Oral, resp. rate 14, height 5\' 9"  (1.753 m), weight 96.2 kg, SpO2 96 %.  PHYSICAL EXAMINATION:  GENERAL:  72 y.o.-year-old patient lying in the bed with no acute distress.  EYES: Pupils equal, round, reactive to light and accommodation. No scleral icterus. Extraocular muscles intact.  HEENT: Head atraumatic, normocephalic. Oropharynx and nasopharynx clear.  NECK:  Supple, no jugular venous distention. No thyroid enlargement, no tenderness.  LUNGS: Normal breath sounds bilaterally, no wheezing, rales,rhonchi or crepitation. No use of accessory muscles of respiration.  CARDIOVASCULAR: S1, S2 normal. No murmurs, rubs, or gallops.  ABDOMEN: Soft, nontender, nondistended. Bowel sounds present. No organomegaly or mass.  EXTREMITIES: No pedal edema, cyanosis, or clubbing.  NEUROLOGIC: Cranial nerves II through XII are intact. Muscle strength 5/5 in all extremities. Sensation intact. Gait not checked.  PSYCHIATRIC: The patient is alert and oriented x 3.  SKIN: No rash, lesion, or ulcer.   LABORATORY PANEL:   CBC Recent Labs  Lab 04/29/18 1616  WBC 10.4  HGB 15.5  HCT 44.7  PLT 282   ------------------------------------------------------------------------------------------------------------------  Chemistries   Recent Labs  Lab 04/29/18 1616  NA 137  K 4.0  CL 105  CO2 23  GLUCOSE 120*  BUN 28*  CREATININE 1.23  CALCIUM 9.1   ------------------------------------------------------------------------------------------------------------------  Cardiac Enzymes Recent Labs  Lab 04/29/18 1616  TROPONINI 0.09*   ------------------------------------------------------------------------------------------------------------------  RADIOLOGY:  Dg Chest 2 View  Result Date: 04/29/2018 CLINICAL DATA:  Hypertension, dizzy spells. EXAM: CHEST - 2 VIEW COMPARISON:  None. FINDINGS: Heart and mediastinal contours are within normal limits. No focal opacities or effusions. No acute bony abnormality. IMPRESSION: No active cardiopulmonary disease. Electronically Signed   By: Rolm Baptise M.D.   On: 04/29/2018 18:29    EKG:   Sinus tachycardia 107 bpm  IMPRESSION AND PLAN:   1.  Near syncope on 3 occasions today.  Check orthostatic vital signs.  IV fluid hydration with bolus today. 2.  Elevated borderline troponin with no chest pain or shortness of breath.  Not sure what to make of this at this point.  Check 2 more heart  enzymes.  Cardiology consultation in the a.m.  It does look like the patient had a stress test 05/23/2017 but I am unable to get the results from the care everywhere portion of the chart. 3.  Acute kidney injury.  IV fluid hydration.  Hold Maxide 4.  History of hypertension.  Continue Norvasc and decrease the dose of lisinopril.  Hold Maxide. 5.  BPH.  Hold Flomax just in case this is orthostatic hypotension.  Continue Proscar. 6.  Hyperlipidemia unspecified on atorvastatin    All the records are reviewed and case discussed with ED provider. Management plans discussed with the patient, family and they are in agreement.  CODE STATUS: Full code  TOTAL TIME TAKING CARE OF THIS PATIENT: 50 minutes, including ACP time.    Loletha Grayer M.D on 04/29/2018 at 7:11 PM  Between 7am to  6pm - Pager - [phone]  After 6pm call admission pager [phone]  [facility] Office  [phone]  CC: Primary care physician; [name], MD

## 2018-04-29 NOTE — ED Provider Notes (Signed)
[facility] Emergency Department Provider Note  Time seen: 5:53 PM  I have reviewed the triage vital signs and the nursing notes.   HISTORY  Chief Complaint Dizziness and Hypertension    HPI Eric W Colbe Viviano. is a 72 y.o. male with a past medical history of diabetes, hypertension, hyperlipidemia, presents to the emergency department for near syncope.  According to the patient over the past several months he has intermittently gotten a near syncopal episode.  He has been seeing his cardiologist Dr. Nehemiah Massed who has been adjusting his blood pressure medications thinking that this was blood pressure related.  Patient states he has not had an episode nearly 2 to 3 weeks however today while he was walking up (walks 2 miles every day) he states he had 3 episodes occurring each lasting approximately 10 seconds which the patient felt very lightheaded/dizzy, and then the symptoms resolved.  Denies any chest pain or trouble breathing denies any nausea or diaphoresis.  Patient is currently symptom-free.   Past Medical History:  Diagnosis Date  . BPH (benign prostatic hypertrophy)   . Diastasis, muscle   . Gout   . HLD (hyperlipidemia)   . HTN (hypertension)   . Microscopic hematuria   . Obesity   . Prostatitis   . Psoriasis   . Renal mass   . Rosacea   . Shoulder pain   . Urinary retention     Patient Active Problem List   Diagnosis Date Noted  . Renal mass 05/08/2016  . Incomplete bladder emptying 04/03/2016  . BPH (benign prostatic hyperplasia) 07/04/2015  . Renal lesion 07/04/2015  . H/O urinary retention 07/04/2015  . Elevated PSA 07/04/2015    Past Surgical History:  Procedure Laterality Date  . hydrostent removal      Prior to Admission medications   Medication Sig Start Date End Date Taking? Authorizing Provider  amLODipine (NORVASC) 10 MG tablet Take 10 mg by mouth daily.  05/02/15 04/29/18 Yes [provider]  atorvastatin  (LIPITOR) 20 MG tablet Take 20 mg by mouth daily.  10/31/15 04/29/18 Yes [provider]  finasteride (PROSCAR) 5 MG tablet TAKE (1) TABLET BY MOUTH EVERY DAY 12/25/17  Yes McGowan, Larene Beach A, PA-C  Fish Oil-Cholecalciferol (FISH OIL + D3) 1000-1000 MG-UNIT CAPS Take 1 capsule by mouth daily.    Yes [provider]  ipratropium (ATROVENT) 0.06 % nasal spray Place 2 sprays into the nose 3 (three) times daily. 04/15/18  Yes [provider]  lisinopril (PRINIVIL,ZESTRIL) 20 MG tablet Take 20 mg by mouth daily.  04/19/16 04/29/18 Yes [provider]  magnesium oxide (MAG-OX) 400 MG tablet Take 400 mg by mouth daily. Reported on 01/03/2016   Yes [provider]  Multiple Vitamins-Minerals (MULTIVITAMIN PO) Take 1 tablet by mouth daily.  12/21/08  Yes [provider]  tamsulosin (FLOMAX) 0.4 MG CAPS capsule Take 1 capsule (0.4 mg total) by mouth daily after supper. 01/04/17  Yes McGowan, Larene Beach A, PA-C  triamterene-hydrochlorothiazide (MAXZIDE-25) 37.5-25 MG tablet Take 1 tablet by mouth daily.  05/02/15  Yes [provider]  finasteride (PROSCAR) 5 MG tablet TAKE (1) TABLET BY MOUTH EVERY DAY Patient not taking: Reported on 04/29/2018 12/27/17   Zara Council A, PA-C    No Known Allergies  Family History  Problem Relation Age of Onset  . COPD Mother   . Heart disease Father   . Kidney disease Neg Hx   . Prostate cancer Neg Hx   . Kidney  cancer Neg Hx   . Bladder Cancer Neg Hx     Social History Social History   Tobacco Use  . Smoking status: Never Smoker  . Smokeless tobacco: Former Systems developer    Types: Chew  Substance Use Topics  . Alcohol use: No    Alcohol/week: 0.0 standard drinks  . Drug use: No    Review of Systems Constitutional: Negative for fever.  Positive for intermittent lightheadedness Cardiovascular: Negative for chest pain. Respiratory: Negative for shortness of breath. Gastrointestinal: Negative for abdominal pain,  vomiting and diarrhea. Genitourinary: Negative for urinary compaints Musculoskeletal: Negative for musculoskeletal complaints Skin: Negative for skin complaints  Neurological: Negative for headache All other ROS negative  ____________________________________________   PHYSICAL EXAM:  VITAL SIGNS: ED Triage Vitals [04/29/18 1613]  Enc Vitals Group     BP (!) 189/75     Pulse Rate (!) 105     Resp 18     Temp 98.4 F (36.9 C)     Temp Source Oral     SpO2 96 %     Weight 212 lb (96.2 kg)     Height 5\' 9"  (1.753 m)     Head Circumference      Peak Flow      Pain Score 0     Pain Loc      Pain Edu?      Excl. in [place]?    Constitutional: Alert and oriented. Well appearing and in no distress. Eyes: Normal exam ENT   Head: Normocephalic and atraumatic.   Mouth/Throat: Mucous membranes are moist. Cardiovascular: Normal rate, regular rhythm. No murmur Respiratory: Normal respiratory effort without tachypnea nor retractions. Breath sounds are clear  Gastrointestinal: Soft and nontender. No distention.  Musculoskeletal: Nontender with normal range of motion in all extremities.  No lower extremity edema or tenderness. Neurologic:  Normal speech and language. No gross focal neurologic deficits Skin:  Skin is warm, dry and intact.  Psychiatric: Mood and affect are normal.   ____________________________________________    EKG  EKG reviewed and interpreted by myself shows sinus tachycardia 106 bpm with a narrow QRS, normal axis, largely normal intervals, nonspecific ST changes without ST elevation.  ____________________________________________   INITIAL IMPRESSION / ASSESSMENT AND PLAN / ED COURSE  Pertinent labs & imaging results that were available during my care of the patient were reviewed by me and considered in my medical decision making (see chart for details).  Patient presents to the emergency department after 3 near syncopal episodes today while walking.  Each  1 lasting approximately 10 seconds and then resolving on its own.  Differential would include ACS, arrhythmia, dehydration, hypotension, metabolic abnormality.  Patient's labs have resulted showing a troponin of 0.09, no old troponins for comparison.  Kidney function is largely normal.  Given the patient's elevated troponin with 3 near syncopal episodes today we will admit to the hospital service for continued monitoring.  I discussed this with the patient he is agreeable to plan of care.  We will dose 325 mg of aspirin as a precaution.    ____________________________________________   FINAL CLINICAL IMPRESSION(S) / ED DIAGNOSES  NSTEMI Near syncope    Harvest Dark, MD 04/30/18 2337

## 2018-04-29 NOTE — ED Notes (Signed)
MD admitting at bedside. 

## 2018-04-29 NOTE — ED Notes (Signed)
Date and time results received: 04/29/18 1716 (use smartphrase ".now" to insert current time)  Test: troponin Critical Value: 0.09  Name of Provider Notified: Dr. Ardeen Garland  Orders Received? Or Actions Taken?: Orders Received - See Orders for details

## 2018-04-29 NOTE — ED Triage Notes (Signed)
Patient reports that he has been having increasing number of dizzy spells for the last 2 weeks. Also reports trouble regulating blood pressure. Reports spells normally occur when he is walking. Patient states he has also had head congestion for the last week.

## 2018-04-30 DIAGNOSIS — R55 Syncope and collapse: Secondary | ICD-10-CM | POA: Diagnosis not present

## 2018-04-30 LAB — LIPID PANEL
CHOL/HDL RATIO: 3.4 ratio
Cholesterol: 118 mg/dL (ref 0–200)
HDL: 35 mg/dL — AB (ref >40)
LDL CALC: 65 mg/dL (ref 0–99)
TRIGLYCERIDES: 91 mg/dL (ref <150)
VLDL: 18 mg/dL (ref 0–40)

## 2018-04-30 LAB — CBC
HEMATOCRIT: 38.6 % — AB (ref 40.0–52.0)
HEMOGLOBIN: 13.5 g/dL (ref 13.0–18.0)
MCH: 30.1 pg (ref 26.0–34.0)
MCHC: 34.9 g/dL (ref 32.0–36.0)
MCV: 86.3 fL (ref 80.0–100.0)
Platelets: 228 10*3/uL (ref 150–440)
RBC: 4.48 MIL/uL (ref 4.40–5.90)
RDW: 13.7 % (ref 11.5–14.5)
WBC: 8.4 10*3/uL (ref 3.8–10.6)

## 2018-04-30 LAB — BASIC METABOLIC PANEL
ANION GAP: 5 (ref 5–15)
BUN: 27 mg/dL — AB (ref 8–23)
CO2: 27 mmol/L (ref 22–32)
Calcium: 8.4 mg/dL — ABNORMAL LOW (ref 8.9–10.3)
Chloride: 108 mmol/L (ref 98–111)
Creatinine, Ser: 1.02 mg/dL (ref 0.61–1.24)
GFR calc Af Amer: >60 mL/min (ref >60)
GFR calc non Af Amer: >60 mL/min (ref >60)
GLUCOSE: 102 mg/dL — AB (ref 70–99)
POTASSIUM: 3.7 mmol/L (ref 3.5–5.1)
Sodium: 140 mmol/L (ref 135–145)

## 2018-04-30 LAB — TROPONIN I: TROPONIN I: 0.08 ng/mL — AB (ref <0.03)

## 2018-04-30 MED ORDER — INFLUENZA VAC SPLIT QUAD 0.5 ML IM SUSY
0.5000 mL | PREFILLED_SYRINGE | Freq: Once | INTRAMUSCULAR | Status: AC
  Administered 2018-04-30: 0.5 mL via INTRAMUSCULAR
  Filled 2018-04-30: qty 0.5

## 2018-04-30 NOTE — Care Management Obs Status (Signed)
[place] NOTIFICATION   Patient Details  Name: Eric Dickson. MRN: [id number] Date of Birth: [birth year]   Medicare Observation Status Notification Given:  Yes    Shelbie Ammons, RN 04/30/2018, 8:51 AM

## 2018-04-30 NOTE — Care Management Note (Signed)
Case Management Note  Patient Details  Name: Eric Dickson. MRN: [id number] Date of Birth: Jul 25, 1946  Subjective/Objective:   Admitted to [facility] under observation status with the diagnosis of chest pain. Lives with wife, Hassan Rowan. [phone]  Last seen Dr. Cathlean Marseilles 3 weeks ago. Prescriptions are filled at [facility] in [place]. No home health. No skilled facility, No medical equipment in the home. Takes care of all basic activities of daily living himself, drives. No falls. Fai appetite. Family will transport                 Action/Plan: No follow-up needs identified. Discharge to home today per Dr. Bridgett Larsson  Expected Discharge Date:  04/30/18               Expected Discharge Plan:     In-House Referral:     Discharge planning Services     Post Acute Care Choice:    Choice offered to:     DME Arranged:    DME Agency:     HH Arranged:    HH Agency:     Status of Service:     If discussed at [employer] of Stay Meetings, dates discussed:    Additional Comments:  Shelbie Ammons, RN MSN CCM Care Management [phone] 04/30/2018, 9:30 AM

## 2018-04-30 NOTE — Progress Notes (Signed)
Initial Nutrition Assessment  DOCUMENTATION CODES:   Not applicable  INTERVENTION:  Educate pt foods that can contribute to indigestion and consuming smaller meals throughout the day and walking after large meals to aid in digestion.   NUTRITION DIAGNOSIS:   Inadequate oral intake related to decreased appetite, altered GI function(heavy indigestion PTA x4days) as evidenced by meal completion < 50%, per patient/family report.   GOAL:   Patient will meet greater than or equal to 90% of their needs   MONITOR:   PO intake, Weight trends  REASON FOR ASSESSMENT:   Malnutrition Screening Tool    ASSESSMENT: Pt admitted for monitoring on 9/24 d/t HTN, AKI, and recent dizzy spells that have progressed in frequency. Per MD note, near syncope likely d/t Bp and prostate meds. PMH: HLD, Obesity   Plans for D/C today. Pt awake and reports feeling great this morning. Wife and daughter in room at visit. Pt endorses good appetite this morning and 100% of ordered breakfast (scrambled eggs, toast w/ jelly and margarine and orange juice) Pt stated he did not want to go overboard w/ eating d/t not having a good BM x 3days. Pt did report a small movement yesterday.  Pt informed RD of decreased appetite over past 4 days d/t experiencing heavy indigestion. Educated pt on eating smaller meals, refraining from lying down w/in an hour of eating, walking after larger meals, and foods that can contribute to indigestion.   Pt reported weighing 214lbs 1.33yrs ago. Pt and wife joined wt watchers and pt stated feeling comfortable at 215lb. Pt reports walking 2 miles 5x/week and strength exercises 3x/week (push-ups and leg squats) Pt presents w/ good muscle tone at assessment.  Pt stated he eats yogurt w/ blueberries, granola, and flaxseed each morning except on the weekends and he treats himself to a fried egg, 1-2 pieces of bacon/sausauge and toast. Lunch pt reports eating canned tomatoes w/ sharp cheddar chz and  whole wheat crackers or an egg salad sandwich. Dinner is usually fish or hamburger with baked pots, and a vegetable. Pt stated he consumes 15 almonds every day and sometimes will snack on plums or nectarines. Wife reports eating out on occasion and stated pt loves a good reuben sandwich.   Medications Reviewed: Atorvastatin, Lovaza, VitD, Pepcid, Mag-Ox At Home Medications: Fish Oil +D3, MVI  Labs Reviewed: Glucose 102, BUN 27 (H), HDL 35 (L) NUTRITION - FOCUSED PHYSICAL EXAM:    Most Recent Value  Orbital Region  No depletion  Upper Arm Region  No depletion  Thoracic and Lumbar Region  No depletion  Buccal Region  No depletion  Temple Region  No depletion  Clavicle Bone Region  No depletion  Clavicle and Acromion Bone Region  No depletion  Scapular Bone Region  No depletion  Dorsal Hand  No depletion  Patellar Region  No depletion  Anterior Thigh Region  No depletion  Posterior Calf Region  No depletion  Edema (RD Assessment)  None  Hair  Reviewed  Eyes  Reviewed  Mouth  Reviewed  Skin  Reviewed  Nails  Reviewed       Diet Order:   Diet Order            Diet - low sodium heart healthy        Diet Heart Room service appropriate? Yes; Fluid consistency: Thin  Diet effective now              EDUCATION NEEDS:      Skin:  Skin  Assessment: Reviewed RN Assessment(WDL)  Last BM:  9/24; pt reports small BM; Type 1  Height:   Ht Readings from Last 1 Encounters:  04/29/18 5\' 9"  (1.753 m)    Weight:   Wt Readings from Last 1 Encounters:  04/29/18 95.7 kg    Ideal Body Weight:  72.7 kg  BMI:  Body mass index is 31.14 kg/m.  Estimated Nutritional Needs:   Kcal:  1900-2100 (20-22kcal/kg)  Protein:  105-120 (1.1-1.3g/kg)  Fluid:  2.1L    Lajuan Lines, RD, LDN  After Hours/Weekend Pager: [phone]

## 2018-04-30 NOTE — Plan of Care (Signed)

## 2018-04-30 NOTE — Consult Note (Signed)
[facility]  CARDIOLOGY CONSULT NOTE  Patient ID: Eric Dickson. MRN: [id number] DOB/AGE: March 15, 1946 72 y.o.  Admit date: 04/29/2018 Referring Physician Bridgett Larsson Primary Physician [facility] Primary Cardiologist Nehemiah Massed Reason for Consultation near syncope  HPI: 11 year old gentleman referred for evaluation of near syncope.  Patient is on multiple blood pressure and prostate medications with intermittent dizziness during the past year.  Today, the patient was walking, had 3 brief episodes of lightheadedness.  And was referred to [facility] ER where initial troponin was borderline elevated 0.09, therefore the patient was admitted for further evaluation.  Troponin was 0.082.  Patient denies chest pain.  ECG reveals sinus tachycardia without ischemic ST-T wave changes.  Patient reports feeling much better, denies dizziness or near syncope this morning.  Review of systems complete and found to be negative unless listed above     Past Medical History:  Diagnosis Date  . BPH (benign prostatic hypertrophy)   . Diastasis, muscle   . Gout   . HLD (hyperlipidemia)   . HTN (hypertension)   . Microscopic hematuria   . Obesity   . Prostatitis   . Psoriasis   . Renal mass   . Rosacea   . Shoulder pain   . Urinary retention     Past Surgical History:  Procedure Laterality Date  . hydrostent removal      Medications Prior to Admission  Medication Sig Dispense Refill Last Dose  . amLODipine (NORVASC) 10 MG tablet Take 10 mg by mouth daily.    Taking  . atorvastatin (LIPITOR) 20 MG tablet Take 20 mg by mouth daily.    Taking  . finasteride (PROSCAR) 5 MG tablet TAKE (1) TABLET BY MOUTH EVERY DAY 90 tablet 0   . Fish Oil-Cholecalciferol (FISH OIL + D3) 1000-1000 MG-UNIT CAPS Take 1 capsule by mouth daily.    Taking  . ipratropium (ATROVENT) 0.06 % nasal spray Place 2 sprays into the nose 3 (three) times daily.     Marland Kitchen lisinopril (PRINIVIL,ZESTRIL) 20 MG tablet Take 20 mg by mouth daily.     Taking  . magnesium oxide (MAG-OX) 400 MG tablet Take 400 mg by mouth daily. Reported on 01/03/2016   Not Taking  . Multiple Vitamins-Minerals (MULTIVITAMIN PO) Take 1 tablet by mouth daily.    Not Taking  . tamsulosin (FLOMAX) 0.4 MG CAPS capsule Take 1 capsule (0.4 mg total) by mouth daily after supper. 90 capsule 3   . triamterene-hydrochlorothiazide (MAXZIDE-25) 37.5-25 MG tablet Take 1 tablet by mouth daily.   3 Taking   Social History   Socioeconomic History  . Marital status: Married    Spouse name: Not on file  . Number of children: Not on file  . Years of education: Not on file  . Highest education level: Not on file  Occupational History  . Not on file  Social Needs  . Financial resource strain: Not on file  . Food insecurity:    Worry: Not on file    Inability: Not on file  . Transportation needs:    Medical: Not on file    Non-medical: Not on file  Tobacco Use  . Smoking status: Never Smoker  . Smokeless tobacco: Former Systems developer    Types: Chew  Substance and Sexual Activity  . Alcohol use: No    Alcohol/week: 0.0 standard drinks  . Drug use: No  . Sexual activity: Not on file  Lifestyle  . Physical activity:    Days per week: Not on file  Minutes per session: Not on file  . Stress: Not on file  Relationships  . Social connections:    Talks on phone: Not on file    Gets together: Not on file    Attends religious service: Not on file    Active member of club or organization: Not on file    Attends meetings of clubs or organizations: Not on file    Relationship status: Not on file  . Intimate partner violence:    Fear of current or ex partner: Not on file    Emotionally abused: Not on file    Physically abused: Not on file    Forced sexual activity: Not on file  Other Topics Concern  . Not on file  Social History Narrative  . Not on file    Family History  Problem Relation Age of Onset  . COPD Mother   . Heart disease Father   . Kidney disease Neg Hx    . Prostate cancer Neg Hx   . Kidney cancer Neg Hx   . Bladder Cancer Neg Hx       Review of systems complete and found to be negative unless listed above      PHYSICAL EXAM  General: Well developed, well nourished, in no acute distress HEENT:  Normocephalic and atramatic Neck:  No JVD.  Lungs: Clear bilaterally to auscultation and percussion. Heart: HRRR . Normal S1 and S2 without gallops or murmurs.  Abdomen: Bowel sounds are positive, abdomen soft and non-tender  Msk:  Back normal, normal gait. Normal strength and tone for age. Extremities: No clubbing, cyanosis or edema.   Neuro: Alert and oriented X 3. Psych:  Good affect, responds appropriately  Labs:   Lab Results  Component Value Date   WBC 8.4 04/30/2018   HGB 13.5 04/30/2018   HCT 38.6 (L) 04/30/2018   MCV 86.3 04/30/2018   PLT 228 04/30/2018    Recent Labs  Lab 04/30/18 0024  NA 140  K 3.7  CL 108  CO2 27  BUN 27*  CREATININE 1.02  CALCIUM 8.4*  GLUCOSE 102*   Lab Results  Component Value Date   TROPONINI 0.08 (HH) 04/30/2018    Lab Results  Component Value Date   CHOL 118 04/30/2018   Lab Results  Component Value Date   HDL 35 (L) 04/30/2018   Lab Results  Component Value Date   LDLCALC 65 04/30/2018   Lab Results  Component Value Date   TRIG 91 04/30/2018   Lab Results  Component Value Date   CHOLHDL 3.4 04/30/2018   No results found for: LDLDIRECT    Radiology: Dg Chest 2 View  Result Date: 04/29/2018 CLINICAL DATA:  Hypertension, dizzy spells. EXAM: CHEST - 2 VIEW COMPARISON:  None. FINDINGS: Heart and mediastinal contours are within normal limits. No focal opacities or effusions. No acute bony abnormality. IMPRESSION: No active cardiopulmonary disease. Electronically Signed   By: Rolm Baptise M.D.   On: 04/29/2018 18:29    EKG: Sinus tachycardia  ASSESSMENT AND PLAN:   1.  Near syncope, likely due to blood pressure and prostate medications, currently appears clinically  stable 2.  Borderline elevated troponin, in the absence of chest pain or ischemic ECG changes  Recommendations  1.  Continue current medication 2.  Defer further cardiac diagnostics at this time 3.  Follow-up with Dr. Nehemiah Massed later this week 4.  Ambulate, may discharge home later today  Signed: Isaias Cowman MD,PhD, [facility] 04/30/2018, 8:23 AM

## 2018-04-30 NOTE — Discharge Summary (Signed)
[name]    MR#:  [id number]  DATE OF BIRTH:  Jan 06, 1946  DATE OF ADMISSION:  04/29/2018   ADMITTING PHYSICIAN: Loletha Grayer, MD  DATE OF DISCHARGE: 04/30/2018 11:13 AM  PRIMARY CARE PHYSICIAN: [name], MD   ADMISSION DIAGNOSIS:  low bp DISCHARGE DIAGNOSIS:  Active Problems:   Near syncope  SECONDARY DIAGNOSIS:   Past Medical History:  Diagnosis Date  . BPH (benign prostatic hypertrophy)   . Diastasis, muscle   . Gout   . HLD (hyperlipidemia)   . HTN (hypertension)   . Microscopic hematuria   . Obesity   . Prostatitis   . Psoriasis   . Renal mass   . Rosacea   . Shoulder pain   . Urinary retention    HOSPITAL COURSE:   1.  Near syncope, possible due to dehydration. He was treated with IV fluid hydration, hold diuretics and Flomax.  2.  Elevated borderline troponin with no chest pain or shortness of breath.  Defer further cardiac diagnostics at this time and to follow-up with Dr. Nehemiah Massed as outpatient this week per Dr. Saralyn Pilar.  3.  Acute kidney injury due to dehydration.    Improved with IV fluid hydration.  Hold Maxide 4.  History of hypertension.  Continue Norvasc and decrease the dose of lisinopril.  Hold Maxide. 5.  BPH.  Hold Flomax. Continue Proscar. 6.  Hyperlipidemia unspecified on atorvastatin I discussed with Dr. Saralyn Pilar. DISCHARGE CONDITIONS:  Stable, discharged to home today. CONSULTS OBTAINED:  Treatment Team:  Isaias Cowman, MD DRUG ALLERGIES:  No Known Allergies DISCHARGE MEDICATIONS:   Allergies as of 04/30/2018   No Known Allergies     Medication List    STOP taking these medications   tamsulosin 0.4 MG Caps capsule Commonly known as:  FLOMAX   triamterene-hydrochlorothiazide 37.5-25 MG tablet Commonly known as:  MAXZIDE-25     TAKE these medications   amLODipine 10 MG tablet Commonly known as:  NORVASC Take 10 mg by  mouth daily.   atorvastatin 20 MG tablet Commonly known as:  LIPITOR Take 20 mg by mouth daily.   finasteride 5 MG tablet Commonly known as:  PROSCAR TAKE (1) TABLET BY MOUTH EVERY DAY   FISH OIL + D3 1000-1000 MG-UNIT Caps Take 1 capsule by mouth daily.   ipratropium 0.06 % nasal spray Commonly known as:  ATROVENT Place 2 sprays into the nose 3 (three) times daily.   lisinopril 20 MG tablet Commonly known as:  PRINIVIL,ZESTRIL Take 20 mg by mouth daily.   magnesium oxide 400 MG tablet Commonly known as:  MAG-OX Take 400 mg by mouth daily. Reported on 01/03/2016   MULTIVITAMIN PO Take 1 tablet by mouth daily.        DISCHARGE INSTRUCTIONS:  See AVS.  If you experience worsening of your admission symptoms, develop shortness of breath, life threatening emergency, suicidal or homicidal thoughts you must seek medical attention immediately by calling 911 or calling your MD immediately  if symptoms less severe.  You Must read complete instructions/literature along with all the possible adverse reactions/side effects for all the Medicines you take and that have been prescribed to you. Take any new Medicines after you have completely understood and accpet all the possible adverse reactions/side effects.   Please note  You were cared for by a hospitalist during your hospital stay. If you have any questions about your discharge medications or the care  you received while you were in the hospital after you are discharged, you can call the unit and asked to speak with the hospitalist on call if the hospitalist that took care of you is not available. Once you are discharged, your primary care physician will handle any further medical issues. Please note that NO REFILLS for any discharge medications will be authorized once you are discharged, as it is imperative that you return to your primary care physician (or establish a relationship with a primary care physician if you do not have one)  for your aftercare needs so that they can reassess your need for medications and monitor your lab values.    On the day of Discharge:  VITAL SIGNS:  Blood pressure (!) 164/73, pulse 79, temperature 97.9 F (36.6 C), temperature source Oral, resp. rate 16, height 5\' 9"  (1.753 m), weight 95.7 kg, SpO2 95 %. PHYSICAL EXAMINATION:  GENERAL:  72 y.o.-year-old patient lying in the bed with no acute distress.  EYES: Pupils equal, round, reactive to light and accommodation. No scleral icterus. Extraocular muscles intact.  HEENT: Head atraumatic, normocephalic. Oropharynx and nasopharynx clear.  NECK:  Supple, no jugular venous distention. No thyroid enlargement, no tenderness.  LUNGS: Normal breath sounds bilaterally, no wheezing, rales,rhonchi or crepitation. No use of accessory muscles of respiration.  CARDIOVASCULAR: S1, S2 normal. No murmurs, rubs, or gallops.  ABDOMEN: Soft, non-tender, non-distended. Bowel sounds present. No organomegaly or mass.  EXTREMITIES: No pedal edema, cyanosis, or clubbing.  NEUROLOGIC: Cranial nerves II through XII are intact. Muscle strength 5/5 in all extremities. Sensation intact. Gait not checked.  PSYCHIATRIC: The patient is alert and oriented x 3.  SKIN: No obvious rash, lesion, or ulcer.  DATA REVIEW:   CBC Recent Labs  Lab 04/30/18 0024  WBC 8.4  HGB 13.5  HCT 38.6*  PLT 228    Chemistries  Recent Labs  Lab 04/30/18 0024  NA 140  K 3.7  CL 108  CO2 27  GLUCOSE 102*  BUN 27*  CREATININE 1.02  CALCIUM 8.4*     Microbiology Results  Results for orders placed or performed in visit on 04/03/16  Microscopic Examination     Status: Abnormal   Collection Time: 04/03/16 11:39 AM  Result Value Ref Range Status   WBC, UA 0-5 0 - 5 /hpf Final   RBC, UA 3-10 (A) 0 - 2 /hpf Final   Epithelial Cells (non renal) 0-10 0 - 10 /hpf Final   Bacteria, UA None seen None seen/Few Final    RADIOLOGY:  Dg Chest 2 View  Result Date:  04/29/2018 CLINICAL DATA:  Hypertension, dizzy spells. EXAM: CHEST - 2 VIEW COMPARISON:  None. FINDINGS: Heart and mediastinal contours are within normal limits. No focal opacities or effusions. No acute bony abnormality. IMPRESSION: No active cardiopulmonary disease. Electronically Signed   By: Rolm Baptise M.D.   On: 04/29/2018 18:29     Management plans discussed with the patient, family and they are in agreement.  CODE STATUS: Prior   TOTAL TIME TAKING CARE OF THIS PATIENT: 27 minutes.    Demetrios Loll M.D on 04/30/2018 at 5:36 PM  Between 7am to 6pm - Pager - [phone]  After 6pm go to [email or web address] - Proofreader  [facility]  Office  [phone]  CC: Primary care physician; [name], MD   Note: This dictation was prepared with Dragon dictation along with smaller phrase technology. Any transcriptional errors that result from this process  are unintentional.

## 2018-05-06 DIAGNOSIS — I951 Orthostatic hypotension: Secondary | ICD-10-CM | POA: Insufficient documentation

## 2018-05-13 ENCOUNTER — Observation Stay
Admission: EM | Admit: 2018-05-13 | Discharge: 2018-05-14 | Disposition: A | Discharge status: Home / Self Care | Payer: Medicare Other | Attending: Internal Medicine | Admitting: Internal Medicine

## 2018-05-13 ENCOUNTER — Encounter: Payer: Self-pay | Admitting: Emergency Medicine

## 2018-05-13 ENCOUNTER — Other Ambulatory Visit: Payer: Self-pay

## 2018-05-13 DIAGNOSIS — N419 Inflammatory disease of prostate, unspecified: Secondary | ICD-10-CM | POA: Diagnosis not present

## 2018-05-13 DIAGNOSIS — K219 Gastro-esophageal reflux disease without esophagitis: Secondary | ICD-10-CM | POA: Diagnosis not present

## 2018-05-13 DIAGNOSIS — R339 Retention of urine, unspecified: Secondary | ICD-10-CM | POA: Insufficient documentation

## 2018-05-13 DIAGNOSIS — I4510 Unspecified right bundle-branch block: Secondary | ICD-10-CM | POA: Insufficient documentation

## 2018-05-13 DIAGNOSIS — E669 Obesity, unspecified: Secondary | ICD-10-CM | POA: Insufficient documentation

## 2018-05-13 DIAGNOSIS — Z825 Family history of asthma and other chronic lower respiratory diseases: Secondary | ICD-10-CM | POA: Insufficient documentation

## 2018-05-13 DIAGNOSIS — Z79899 Other long term (current) drug therapy: Secondary | ICD-10-CM | POA: Diagnosis not present

## 2018-05-13 DIAGNOSIS — N401 Enlarged prostate with lower urinary tract symptoms: Secondary | ICD-10-CM | POA: Diagnosis not present

## 2018-05-13 DIAGNOSIS — M109 Gout, unspecified: Secondary | ICD-10-CM | POA: Insufficient documentation

## 2018-05-13 DIAGNOSIS — I10 Essential (primary) hypertension: Secondary | ICD-10-CM | POA: Diagnosis not present

## 2018-05-13 DIAGNOSIS — M25519 Pain in unspecified shoulder: Secondary | ICD-10-CM | POA: Insufficient documentation

## 2018-05-13 DIAGNOSIS — Z6830 Body mass index (BMI) 30.0-30.9, adult: Secondary | ICD-10-CM | POA: Insufficient documentation

## 2018-05-13 DIAGNOSIS — I214 Non-ST elevation (NSTEMI) myocardial infarction: Secondary | ICD-10-CM

## 2018-05-13 DIAGNOSIS — Z9889 Other specified postprocedural states: Secondary | ICD-10-CM | POA: Diagnosis not present

## 2018-05-13 DIAGNOSIS — R142 Eructation: Secondary | ICD-10-CM | POA: Diagnosis present

## 2018-05-13 DIAGNOSIS — L719 Rosacea, unspecified: Secondary | ICD-10-CM | POA: Diagnosis not present

## 2018-05-13 DIAGNOSIS — Z87891 Personal history of nicotine dependence: Secondary | ICD-10-CM | POA: Insufficient documentation

## 2018-05-13 DIAGNOSIS — M6200 Separation of muscle (nontraumatic), unspecified site: Secondary | ICD-10-CM | POA: Diagnosis not present

## 2018-05-13 DIAGNOSIS — Z7982 Long term (current) use of aspirin: Secondary | ICD-10-CM | POA: Insufficient documentation

## 2018-05-13 DIAGNOSIS — Z8249 Family history of ischemic heart disease and other diseases of the circulatory system: Secondary | ICD-10-CM | POA: Insufficient documentation

## 2018-05-13 DIAGNOSIS — I491 Atrial premature depolarization: Secondary | ICD-10-CM | POA: Diagnosis not present

## 2018-05-13 DIAGNOSIS — R55 Syncope and collapse: Secondary | ICD-10-CM | POA: Diagnosis not present

## 2018-05-13 DIAGNOSIS — E785 Hyperlipidemia, unspecified: Secondary | ICD-10-CM | POA: Diagnosis not present

## 2018-05-13 DIAGNOSIS — R12 Heartburn: Secondary | ICD-10-CM | POA: Diagnosis present

## 2018-05-13 LAB — CBC WITH DIFFERENTIAL/PLATELET
ABS IMMATURE GRANULOCYTES: 0.03 10*3/uL (ref 0.00–0.07)
BASOS PCT: 1 %
Basophils Absolute: 0.1 10*3/uL (ref 0.0–0.1)
Eosinophils Absolute: 0.1 10*3/uL (ref 0.0–0.5)
Eosinophils Relative: 1 %
HEMATOCRIT: 41.8 % (ref 39.0–52.0)
Hemoglobin: 14.4 g/dL (ref 13.0–17.0)
IMMATURE GRANULOCYTES: 0 %
LYMPHS ABS: 1.7 10*3/uL (ref 0.7–4.0)
Lymphocytes Relative: 20 %
MCH: 29.1 pg (ref 26.0–34.0)
MCHC: 34.4 g/dL (ref 30.0–36.0)
MCV: 84.6 fL (ref 80.0–100.0)
MONO ABS: 0.7 10*3/uL (ref 0.1–1.0)
MONOS PCT: 8 %
NEUTROS ABS: 5.9 10*3/uL (ref 1.7–7.7)
NEUTROS PCT: 70 %
Platelets: 251 10*3/uL (ref 150–400)
RBC: 4.94 MIL/uL (ref 4.22–5.81)
RDW: 12.7 % (ref 11.5–15.5)
WBC: 8.5 10*3/uL (ref 4.0–10.5)
nRBC: 0 % (ref 0.0–0.2)

## 2018-05-13 LAB — LIPASE, BLOOD: Lipase: 19 U/L (ref 11–51)

## 2018-05-13 LAB — BASIC METABOLIC PANEL
ANION GAP: 8 (ref 5–15)
BUN: 17 mg/dL (ref 8–23)
CHLORIDE: 109 mmol/L (ref 98–111)
CO2: 24 mmol/L (ref 22–32)
Calcium: 8.8 mg/dL — ABNORMAL LOW (ref 8.9–10.3)
Creatinine, Ser: 0.89 mg/dL (ref 0.61–1.24)
GFR calc Af Amer: >60 mL/min (ref >60)
GLUCOSE: 116 mg/dL — AB (ref 70–99)
POTASSIUM: 3.5 mmol/L (ref 3.5–5.1)
Sodium: 141 mmol/L (ref 135–145)

## 2018-05-13 LAB — PROTIME-INR
INR: 1.06
PROTHROMBIN TIME: 13.7 s (ref 11.4–15.2)

## 2018-05-13 LAB — TROPONIN I
TROPONIN I: 0.08 ng/mL — AB (ref <0.03)
TROPONIN I: 0.09 ng/mL — AB (ref <0.03)
Troponin I: 0.1 ng/mL (ref <0.03)

## 2018-05-13 LAB — APTT: aPTT: 30 seconds (ref 24–36)

## 2018-05-13 MED ORDER — TRAZODONE HCL 50 MG PO TABS
25.0000 mg | ORAL_TABLET | Freq: Every evening | ORAL | Status: DC | PRN
  Administered 2018-05-13: 25 mg via ORAL
  Filled 2018-05-13: qty 1

## 2018-05-13 MED ORDER — ONDANSETRON HCL 4 MG PO TABS: 4.0000 mg | ORAL_TABLET | Freq: Four times a day (QID) | ORAL | Status: DC | PRN

## 2018-05-13 MED ORDER — FINASTERIDE 5 MG PO TABS
5.0000 mg | ORAL_TABLET | Freq: Every day | ORAL | Status: DC
  Administered 2018-05-14: 5 mg via ORAL
  Filled 2018-05-13: qty 1

## 2018-05-13 MED ORDER — HEPARIN SODIUM (PORCINE) 5000 UNIT/ML IJ SOLN
4000.0000 [IU] | Freq: Once | INTRAMUSCULAR | Status: AC
  Administered 2018-05-13: 4000 [IU] via INTRAVENOUS
  Filled 2018-05-13: qty 1

## 2018-05-13 MED ORDER — ACETAMINOPHEN 650 MG RE SUPP: 650.0000 mg | Freq: Four times a day (QID) | RECTAL | Status: DC | PRN

## 2018-05-13 MED ORDER — LISINOPRIL 10 MG PO TABS: 20.0000 mg | ORAL_TABLET | Freq: Every day | ORAL | Status: DC

## 2018-05-13 MED ORDER — IPRATROPIUM BROMIDE 0.06 % NA SOLN
2.0000 | Freq: Three times a day (TID) | NASAL | Status: DC
  Administered 2018-05-14: 2 via NASAL
  Filled 2018-05-13: qty 15

## 2018-05-13 MED ORDER — ATORVASTATIN CALCIUM 20 MG PO TABS
20.0000 mg | ORAL_TABLET | Freq: Every day | ORAL | Status: DC
  Administered 2018-05-14: 20 mg via ORAL
  Filled 2018-05-13: qty 1

## 2018-05-13 MED ORDER — ONDANSETRON HCL 4 MG/2ML IJ SOLN: 4.0000 mg | Freq: Four times a day (QID) | INTRAMUSCULAR | Status: DC | PRN

## 2018-05-13 MED ORDER — AMLODIPINE BESYLATE 5 MG PO TABS
10.0000 mg | ORAL_TABLET | Freq: Every day | ORAL | Status: DC
  Administered 2018-05-14: 10 mg via ORAL
  Filled 2018-05-13: qty 2

## 2018-05-13 MED ORDER — ENOXAPARIN SODIUM 40 MG/0.4ML ~~LOC~~ SOLN
40.0000 mg | SUBCUTANEOUS | Status: DC
  Administered 2018-05-13: 40 mg via SUBCUTANEOUS
  Filled 2018-05-13: qty 0.4

## 2018-05-13 MED ORDER — SODIUM CHLORIDE 0.9 % IV SOLN
INTRAVENOUS | Status: DC | PRN
  Administered 2018-05-13 – 2018-05-14 (×2): 500 mL via INTRAVENOUS

## 2018-05-13 MED ORDER — HEPARIN (PORCINE) IN NACL 100-0.45 UNIT/ML-% IJ SOLN
1150.0000 [IU]/h | INTRAMUSCULAR | Status: DC
  Filled 2018-05-13 (×2): qty 250

## 2018-05-13 MED ORDER — HEPARIN (PORCINE) IN NACL 100-0.45 UNIT/ML-% IJ SOLN: 12.0000 [IU]/kg/h | Freq: Once | INTRAMUSCULAR | Status: DC

## 2018-05-13 MED ORDER — DOCUSATE SODIUM 100 MG PO CAPS
100.0000 mg | ORAL_CAPSULE | Freq: Two times a day (BID) | ORAL | Status: DC
  Administered 2018-05-14: 100 mg via ORAL
  Filled 2018-05-13 (×2): qty 1

## 2018-05-13 MED ORDER — ACETAMINOPHEN 325 MG PO TABS: 650.0000 mg | ORAL_TABLET | Freq: Four times a day (QID) | ORAL | Status: DC | PRN

## 2018-05-13 MED ORDER — RANITIDINE HCL 150 MG/10ML PO SYRP
150.0000 mg | ORAL_SOLUTION | Freq: Once | ORAL | Status: AC
  Administered 2018-05-13: 150 mg via ORAL
  Filled 2018-05-13 (×2): qty 10

## 2018-05-13 MED ORDER — ASPIRIN 81 MG PO CHEW
324.0000 mg | CHEWABLE_TABLET | Freq: Once | ORAL | Status: AC
  Administered 2018-05-13: 324 mg via ORAL
  Filled 2018-05-13: qty 4

## 2018-05-13 MED ORDER — TAMSULOSIN HCL 0.4 MG PO CAPS
0.4000 mg | ORAL_CAPSULE | Freq: Every day | ORAL | Status: DC
  Administered 2018-05-14: 0.4 mg via ORAL
  Filled 2018-05-13: qty 1

## 2018-05-13 MED ORDER — HYDROCODONE-ACETAMINOPHEN 5-325 MG PO TABS: 1.0000 | ORAL_TABLET | ORAL | Status: DC | PRN

## 2018-05-13 MED ORDER — LISINOPRIL 20 MG PO TABS
20.0000 mg | ORAL_TABLET | Freq: Two times a day (BID) | ORAL | Status: DC
  Administered 2018-05-13 – 2018-05-14 (×2): 20 mg via ORAL
  Filled 2018-05-13 (×2): qty 1

## 2018-05-13 MED ORDER — SIMETHICONE 80 MG PO CHEW
80.0000 mg | CHEWABLE_TABLET | Freq: Once | ORAL | Status: AC
  Administered 2018-05-13: 80 mg via ORAL
  Filled 2018-05-13 (×2): qty 1

## 2018-05-13 MED ORDER — SUCRALFATE 1 G PO TABS
1.0000 g | ORAL_TABLET | Freq: Three times a day (TID) | ORAL | Status: DC
  Administered 2018-05-13 – 2018-05-14 (×3): 1 g via ORAL
  Filled 2018-05-13 (×3): qty 1

## 2018-05-13 MED ORDER — BISACODYL 5 MG PO TBEC: 5.0000 mg | DELAYED_RELEASE_TABLET | Freq: Every day | ORAL | Status: DC | PRN

## 2018-05-13 MED ORDER — FAMOTIDINE IN NACL 20-0.9 MG/50ML-% IV SOLN
20.0000 mg | Freq: Two times a day (BID) | INTRAVENOUS | Status: DC
  Administered 2018-05-13 – 2018-05-14 (×2): 20 mg via INTRAVENOUS
  Filled 2018-05-13 (×2): qty 50

## 2018-05-13 NOTE — H&P (Signed)
[place] [facility] [place] NAME: Eric Dickson    MR#:  [id number]  DATE OF BIRTH:  30-Nov-1945  DATE OF ADMISSION:  05/13/2018  PRIMARY CARE PHYSICIAN: [name], MD   REQUESTING/REFERRING PHYSICIAN: Dr. Mariea Clonts  CHIEF COMPLAINT: Reflux problems   Chief Complaint  Patient presents with  . Gastroesophageal Reflux    HISTORY OF PRESENT ILLNESS:  Eric Dickson  is a 72 y.o. male with a known history of essential hypertension, recently was here discharged on September 25 that time he was admitted for evaluation of syncope, patient scheduled to have outpatient stress test next week, today he was feeling significant gas problems with lots of belching, sour taste in the mouth.  ER physician checked a troponin which are slightly higher than before at 0.1 even though i she denies any chest pain.  Dr. Ubaldo Glassing has been contacted, he wants the patient to be observed overnight and have a stress test sooner.  PAST MEDICAL HISTORY:   Past Medical History:  Diagnosis Date  . BPH (benign prostatic hypertrophy)   . Diastasis, muscle   . Gout   . HLD (hyperlipidemia)   . HTN (hypertension)   . Microscopic hematuria   . Obesity   . Prostatitis   . Psoriasis   . Renal mass   . Rosacea   . Shoulder pain   . Urinary retention     PAST SURGICAL HISTOIRY:   Past Surgical History:  Procedure Laterality Date  . hydrostent removal      SOCIAL HISTORY:   Social History   Tobacco Use  . Smoking status: Never Smoker  . Smokeless tobacco: Former Systems developer    Types: Chew  Substance Use Topics  . Alcohol use: No    Alcohol/week: 0.0 standard drinks    FAMILY HISTORY:   Family History  Problem Relation Age of Onset  . COPD Mother   . Heart disease Father   . Kidney disease Neg Hx   . Prostate cancer Neg Hx   . Kidney cancer Neg Hx   . Bladder Cancer Neg Hx     DRUG ALLERGIES:  No Known Allergies  REVIEW OF SYSTEMS:   CONSTITUTIONAL: No fever, fatigue or weakness.  EYES: No blurred or double vision.  EARS, NOSE, AND THROAT: No tinnitus or ear pain.  RESPIRATORY: No cough, shortness of breath, wheezing or hemoptysis.  CARDIOVASCULAR: No chest pain, orthopnea, edema.  GASTROINTESTINAL: abdominal pain,heart burn  ENDOCRINE: No polyuria, nocturia,  HEMATOLOGY: No anemia, easy bruising or bleeding SKIN: No rash or lesion. MUSCULOSKELETAL: No joint pain or arthritis.   NEUROLOGIC: No tingling, numbness, weakness.  PSYCHIATRY: No anxiety or depression.   MEDICATIONS AT HOME:   Prior to Admission medications   Medication Sig Start Date End Date Taking? Authorizing Provider  amLODipine (NORVASC) 10 MG tablet Take 10 mg by mouth daily.  05/02/15 05/13/18 Yes [provider]  atorvastatin (LIPITOR) 20 MG tablet Take 20 mg by mouth daily.  10/31/15 05/13/18 Yes [provider]  finasteride (PROSCAR) 5 MG tablet TAKE (1) TABLET BY MOUTH EVERY DAY Patient taking differently: Take 5 mg by mouth daily.  12/25/17  Yes McGowan, Larene Beach A, PA-C  ipratropium (ATROVENT) 0.06 % nasal spray Place 2 sprays into the nose 3 (three) times daily as needed for rhinitis.    Yes [provider]  lisinopril (PRINIVIL,ZESTRIL) 20 MG tablet Take 20 mg by mouth daily.  04/19/16 05/13/18 Yes [provider]  omega-3 acid ethyl esters (LOVAZA) 1 g capsule Take 1 g by mouth daily.   Yes [provider]  tamsulosin (FLOMAX) 0.4 MG CAPS capsule Take 0.4 mg by mouth daily.   Yes [provider]      VITAL SIGNS:  Blood pressure (!) 168/67, pulse 84, temperature 98.2 F (36.8 C), temperature source Oral, resp. rate 18, height 5\' 9"  (1.753 m), weight 95.7 kg, SpO2 94 %.  PHYSICAL EXAMINATION:  GENERAL:  72 y.o.-year-old patient lying in the bed with no acute distress.  EYES: Pupils equal, round, reactive to light and accommodation. No scleral icterus. Extraocular muscles intact.  HEENT:  Head atraumatic, normocephalic. Oropharynx and nasopharynx clear.  NECK:  Supple, no jugular venous distention. No thyroid enlargement, no tenderness.  LUNGS: Normal breath sounds bilaterally, no wheezing, rales,rhonchi or crepitation. No use of accessory muscles of respiration.  CARDIOVASCULAR: S1, S2 normal. No murmurs, rubs, or gallops.  ABDOMEN: Soft, nontender, nondistended. Bowel sounds present. No organomegaly or mass.  EXTREMITIES: No pedal edema, cyanosis, or clubbing.  NEUROLOGIC: Cranial nerves II through XII are intact. Muscle strength 5/5 in all extremities. Sensation intact. Gait not checked.  PSYCHIATRIC: The patient is alert and oriented x 3.  SKIN: No obvious rash, lesion, or ulcer.   LABORATORY PANEL:   CBC Recent Labs  Lab 05/13/18 1415  WBC 8.5  HGB 14.4  HCT 41.8  PLT 251   ------------------------------------------------------------------------------------------------------------------  Chemistries  Recent Labs  Lab 05/13/18 1415  NA 141  K 3.5  CL 109  CO2 24  GLUCOSE 116*  BUN 17  CREATININE 0.89  CALCIUM 8.8*   ------------------------------------------------------------------------------------------------------------------  Cardiac Enzymes Recent Labs  Lab 05/13/18 1415  TROPONINI 0.10*   ------------------------------------------------------------------------------------------------------------------  RADIOLOGY:  No results found.  EKG:   Orders placed or performed during the hospital encounter of 05/13/18  . ED EKG  . ED EKG   EKG shows sinus rhythm with 85/min, has some PVCs. IMPRESSION AND PLAN:  72 year old male patient with multiple medical problems of essential hypertension, BPH, recent admission for syncope, acute kidney injury supposed to have stress test next week comes in today because of abdominal pain, significant reflux problems with heartburn, acid taste in the mouth, ER physician did the EKG, troponins which are  essentially within normal range but Dr. Ubaldo Glassing has been contacted by ER physician, #1 GERD, his main complaint is acid reflux with soreness in the mouth: Continue IV PPI, add Carafate, 2.  Recent syncope, patient scheduled to have stress test next week but admitted to observation, order stress test by cardiology Dr. Ubaldo Glassing for tomorrow if the stress test is normal likely discharge home tomorrow. 3.  BPH: Continue Flomax 4.  Hyperlipidemia: Continue statins 5.     All the records are reviewed and case discussed with ED provider. Management plans discussed with the patient, family and they are in agreement.  CODE STATUS: full  TOTAL TIME TAKING CARE OF THIS PATIENT: 57minutes.    Epifanio Lesches M.D on 05/13/2018 at 4:27 PM  Between 7am to 6pm - Pager - [phone]  After 6pm go to [email or web address] - password [email or web address] [place]  Office  [phone]  CC: Primary care physician; [name], MD  Note: This dictation was prepared with Dragon dictation along with smaller phrase technology. Any transcriptional errors that result from this process are unintentional.

## 2018-05-13 NOTE — Consult Note (Signed)
[place] for heparin infusion Indication: chest pain/ACS  No Known Allergies  Patient Measurements: Height: 5\' 9"  (175.3 cm) Weight: 210 lb 14.4 oz (95.7 kg) IBW/kg (Calculated) : 70.7 Heparin Dosing Weight: 90.57 kg  Vital Signs: Temp: 98.2 F (36.8 C) (10/08 1403) Temp Source: Oral (10/08 1403) BP: 168/67 (10/08 1600) Pulse Rate: 84 (10/08 1517)  Labs: Recent Labs    05/13/18 1415  HGB 14.4  HCT 41.8  PLT 251  CREATININE 0.89  TROPONINI 0.10*    Estimated Creatinine Clearance: 85.6 mL/min (by C-G formula based on SCr of 0.89 mg/dL).  Assessment: 72 y.o. male with a history of HTN, HL, presenting with epigastric discomfort.  His symptoms are noted to be consistent with reflux. However, given his cardiac history, ACS or MI is also possible and Dr Mariea Clonts feels he may be having atypical symptoms.  His EKG does not show any ischemic changes, and troponin is 0.10 ng/mL. He is on no anticoagulants PTA. Baseline labs have been ordered  Goal of Therapy:  Heparin level 0.3-0.7 units/ml Monitor platelets by anticoagulation protocol: Yes   Plan:  Give 4000 units bolus x 1 Start heparin infusion at 1150 units/hr Check anti-Xa level in 8 hours and daily while on heparin Continue to monitor H&H and platelets  Dallie Piles, PharmD 05/13/2018,4:09 PM

## 2018-05-13 NOTE — ED Provider Notes (Signed)
[facility] Emergency Department Provider Note  ____________________________________________  Time seen: Approximately 3:18 PM  I have reviewed the triage vital signs and the nursing notes.   HISTORY  Chief Complaint Gastroesophageal Reflux    HPI Eric Dickson. is a 72 y.o. male with a history of HTN, HL, presenting for epigastric discomfort, burping.  The patient reports that for the past 3 weeks, he has been having a daily epigastric discomfort with a sensation that "everything is coming up" with an acidic taste in the back of his mouth.  Every time he tries to eat or drink, it causes a significant amount of burping.  He is also having flatulence, but this is less prominent.  He is having normal bowel movements; his last bowel movement was this morning completely normal.  No lower abdominal pain, fevers or chills, chest pain or shortness of breath.  The patient was hospitalized last week for episodes of hypotension with exertion, and is scheduled to have an outpatient stress test and follow-up with Dr. Nehemiah Massed on Monday.  The patient did see his primary care physician about his reflux symptoms, and has been trying a bland diet and twice daily Prilosec without significant improvement.  He has an appointment to follow-up with GI tomorrow.  Past Medical History:  Diagnosis Date  . BPH (benign prostatic hypertrophy)   . Diastasis, muscle   . Gout   . HLD (hyperlipidemia)   . HTN (hypertension)   . Microscopic hematuria   . Obesity   . Prostatitis   . Psoriasis   . Renal mass   . Rosacea   . Shoulder pain   . Urinary retention     Patient Active Problem List   Diagnosis Date Noted  . Near syncope 04/29/2018  . Renal mass 05/08/2016  . Incomplete bladder emptying 04/03/2016  . BPH (benign prostatic hyperplasia) 07/04/2015  . Renal lesion 07/04/2015  . H/O urinary retention 07/04/2015  . Elevated PSA 07/04/2015    Past Surgical History:   Procedure Laterality Date  . hydrostent removal      Current Outpatient Rx  . Order #: [id number] Class: Historical Med  . Order #: [id number] Class: Historical Med  . Order #: [id number] Class: Normal  . Order #: [id number] Class: Historical Med  . Order #: [id number] Class: Historical Med  . Order #: [id number] Class: Historical Med  . Order #: [id number] Class: Historical Med  . Order #: [id number] Class: Historical Med    Allergies Patient has no known allergies.  Family History  Problem Relation Age of Onset  . COPD Mother   . Heart disease Father   . Kidney disease Neg Hx   . Prostate cancer Neg Hx   . Kidney cancer Neg Hx   . Bladder Cancer Neg Hx     Social History Social History   Tobacco Use  . Smoking status: Never Smoker  . Smokeless tobacco: Former Systems developer    Types: Chew  Substance Use Topics  . Alcohol use: No    Alcohol/week: 0.0 standard drinks  . Drug use: No    Review of Systems Constitutional: No fever/chills.  No lightheadedness or syncope. Eyes: No visual changes. ENT: No sore throat. No congestion or rhinorrhea. Cardiovascular: Denies chest pain. Denies palpitations. Respiratory: Denies shortness of breath.  No cough. Gastrointestinal: Positive epigastric abdominal discomfort, not pain.  Sensation of a large amount of burping and mild flatulence..  No nausea, no vomiting.  No diarrhea.  No constipation. Genitourinary: Negative for dysuria. Musculoskeletal: Negative for back  pain. Skin: Negative for rash. Neurological: Negative for headaches. No focal numbness, tingling or weakness.     ____________________________________________   PHYSICAL EXAM:  VITAL SIGNS: ED Triage Vitals  Enc Vitals Group     BP 05/13/18 1403 (!) 181/68     Pulse Rate 05/13/18 1403 (!) 52     Resp 05/13/18 1403 18     Temp 05/13/18 1403 98.2 F (36.8 C)     Temp Source 05/13/18 1403 Oral     SpO2 05/13/18 1403 98 %     Weight 05/13/18 1404 210 lb 14.4 oz (95.7 kg)      Height 05/13/18 1404 5\' 9"  (1.753 m)     Head Circumference --      Peak Flow --      Pain Score 05/13/18 1403 3     Pain Loc --      Pain Edu? --      Excl. in [place]? --     Constitutional: Alert and oriented. Answers questions appropriately. Eyes: Conjunctivae are normal.  EOMI. No scleral icterus. Head: Atraumatic. Nose: No congestion/rhinnorhea. Mouth/Throat: Mucous membranes are moist.  Neck: No stridor.  Supple.  No JVD.  No meningismus. Cardiovascular: Normal rate, regular rhythm. No murmurs, rubs or gallops.  Respiratory: Normal respiratory effort.  No accessory muscle use or retractions. Lungs CTAB.  No wheezes, rales or ronchi. Gastrointestinal: Overweight.  Soft, nontender and nondistended.  No reproducible tenderness to palpation on my examination.  No guarding or rebound.  No peritoneal signs. Musculoskeletal: No LE edema.  Neurologic:  A&Ox3.  Speech is clear.  Face and smile are symmetric.  EOMI.  Moves all extremities well. Skin:  Skin is warm, dry and intact. No rash noted. Psychiatric: Mood and affect are normal. Speech and behavior are normal.  Normal judgement.  ____________________________________________   LABS (all labs ordered are listed, but only abnormal results are displayed)  Labs Reviewed  BASIC METABOLIC PANEL - Abnormal; Notable for the following components:      Result Value   Glucose, Bld 116 (*)    Calcium 8.8 (*)    All other components within normal limits  TROPONIN I - Abnormal; Notable for the following components:   Troponin I 0.10 (*)    All other components within normal limits  CBC WITH DIFFERENTIAL/PLATELET  LIPASE, BLOOD   ____________________________________________  EKG  ED ECG REPORT I, Anne-Caroline Mariea Clonts, the attending physician, personally viewed and interpreted this ECG.   Date: 05/13/2018  EKG Time: 1406  Rate: 85  Rhythm: normal sinus rhythm; + PVC  Axis: leftward  Intervals:first-degree A-V block   ST&T Change:  No STEMI  ____________________________________________  RADIOLOGY  No results found.  ____________________________________________   PROCEDURES  Procedure(s) performed: None  Procedures  Critical Care performed: Yes ____________________________________________   INITIAL IMPRESSION / ASSESSMENT AND PLAN / ED COURSE  Pertinent labs & imaging results that were available during my care of the patient were reviewed by me and considered in my medical decision making (see chart for details).  72 y.o. male with a history of HTN, HL, presenting with epigastric discomfort and burping that is been going on for 3 weeks.  Overall, the patient is hemodynamically stable.  His symptoms are most consistent with reflux, and I would also consider esophageal spasm, peptic ulcer or gastric ulcer disease.  However, given his cardiac history, ACS or MI is also possible and he may be having atypical symptoms.  His EKG does not show any ischemic changes,  and troponin is pending.  At this time, we will treat the patient with Zantac and simethicone to see if this can improve his symptoms.  Plan reevaluation for final disposition.  ----------------------------------------- 3:39 PM on 05/13/2018 -----------------------------------------  The patient was admitted to the hospital was discharged 04/30/2018 after admission for episodes of hypotension with exertion.  The patient had troponins of 0.08 2.09 during his admission without any ischemic changes on his EKG.  He was seen by Dr. Saralyn Pilar, with the plan for follow-up with Dr. Nehemiah Massed, outpatient stress test.  The patient's troponin today is 0.10.  I will talk to the cardiologist for further evaluation.  ----------------------------------------- 3:58 PM on 05/13/2018 -----------------------------------------  I have spoken with Dr. Ubaldo Glassing, the cardiologist on-call for Dr. Nehemiah Massed, who agrees with the plan for admission and inpatient stress test.  Given  ongoing symptoms, we will treat the patient with aspirin and heparinization at this time.  CRITICAL CARE Performed by: Eula Listen   Total critical care time: 35 minutes  Critical care time was exclusive of separately billable procedures and treating other patients.  Critical care was necessary to treat or prevent imminent or life-threatening deterioration.  Critical care was time spent personally by me on the following activities: development of treatment plan with patient and/or surrogate as well as nursing, discussions with consultants, evaluation of patient's response to treatment, examination of patient, obtaining history from patient or surrogate, ordering and performing treatments and interventions, ordering and review of laboratory studies, ordering and review of radiographic studies, pulse oximetry and re-evaluation of patient's condition.     ____________________________________________  FINAL CLINICAL IMPRESSION(S) / ED DIAGNOSES  Final diagnoses:  NSTEMI (non-ST elevated myocardial infarction) ([place])  Burping         NEW MEDICATIONS STARTED DURING THIS VISIT:  New Prescriptions   No medications on file      Eula Listen, MD 05/13/18 1559

## 2018-05-13 NOTE — ED Triage Notes (Signed)
Pt reports that he has had "gas" he states that if he eats or drinks he starts belching and has a sour taste in his mouth. He reports that he has taken pepcid but it has not helped. He went to his PMD and they set him up with a GI doc in the am. He reports that he is just feels that he cant eat and he is forcing water down so he does not become dehydrated.

## 2018-05-14 ENCOUNTER — Observation Stay: Payer: Medicare Other

## 2018-05-14 DIAGNOSIS — K219 Gastro-esophageal reflux disease without esophagitis: Secondary | ICD-10-CM | POA: Diagnosis not present

## 2018-05-14 LAB — CBC
HCT: 39.5 % (ref 39.0–52.0)
Hemoglobin: 13.3 g/dL (ref 13.0–17.0)
MCH: 28.9 pg (ref 26.0–34.0)
MCHC: 33.7 g/dL (ref 30.0–36.0)
MCV: 85.9 fL (ref 80.0–100.0)
PLATELETS: 240 10*3/uL (ref 150–400)
RBC: 4.6 MIL/uL (ref 4.22–5.81)
RDW: 12.7 % (ref 11.5–15.5)
WBC: 8.1 10*3/uL (ref 4.0–10.5)
nRBC: 0 % (ref 0.0–0.2)

## 2018-05-14 LAB — GLUCOSE, CAPILLARY
GLUCOSE-CAPILLARY: 91 mg/dL (ref 70–99)
Glucose-Capillary: 101 mg/dL — ABNORMAL HIGH (ref 70–99)

## 2018-05-14 LAB — BASIC METABOLIC PANEL
ANION GAP: 10 (ref 5–15)
BUN: 17 mg/dL (ref 8–23)
CALCIUM: 8.5 mg/dL — AB (ref 8.9–10.3)
CO2: 24 mmol/L (ref 22–32)
Chloride: 109 mmol/L (ref 98–111)
Creatinine, Ser: 0.84 mg/dL (ref 0.61–1.24)
GFR calc Af Amer: >60 mL/min (ref >60)
Glucose, Bld: 92 mg/dL (ref 70–99)
Potassium: 3.4 mmol/L — ABNORMAL LOW (ref 3.5–5.1)
Sodium: 143 mmol/L (ref 135–145)

## 2018-05-14 LAB — NM MYOCAR MULTI W/SPECT W/WALL MOTION / EF
CHL CUP MPHR: 148 {beats}/min
CSEPED: 1 min
CSEPEDS: 0 s
CSEPHR: 78 %
Estimated workload: 1 METS
LV dias vol: 95 mL (ref 62–150)
LV sys vol: 36 mL
NUC STRESS TID: 0.89
Peak HR: 116 {beats}/min
Rest HR: 83 {beats}/min
SDS: 4
SRS: 13
SSS: 12

## 2018-05-14 LAB — TROPONIN I: TROPONIN I: 0.09 ng/mL — AB (ref <0.03)

## 2018-05-14 LAB — MAGNESIUM: Magnesium: 2.2 mg/dL (ref 1.7–2.4)

## 2018-05-14 MED ORDER — REGADENOSON 0.4 MG/5ML IV SOLN
0.4000 mg | Freq: Once | INTRAVENOUS | Status: AC
  Administered 2018-05-14: 0.4 mg via INTRAVENOUS
  Filled 2018-05-14: qty 5

## 2018-05-14 MED ORDER — SUCRALFATE 1 G PO TABS: 1.0000 g | ORAL_TABLET | Freq: Three times a day (TID) | ORAL | 0 refills | Status: DC

## 2018-05-14 MED ORDER — TECHNETIUM TC 99M TETROFOSMIN IV KIT
30.0000 | PACK | Freq: Once | INTRAVENOUS | Status: AC | PRN
  Administered 2018-05-14: 28.56 via INTRAVENOUS

## 2018-05-14 MED ORDER — POTASSIUM CHLORIDE CRYS ER 20 MEQ PO TBCR
40.0000 meq | EXTENDED_RELEASE_TABLET | Freq: Once | ORAL | Status: AC
  Administered 2018-05-14: 40 meq via ORAL
  Filled 2018-05-14: qty 2

## 2018-05-14 MED ORDER — TECHNETIUM TC 99M TETROFOSMIN IV KIT
10.0000 | PACK | Freq: Once | INTRAVENOUS | Status: AC | PRN
  Administered 2018-05-14: 10.1 via INTRAVENOUS

## 2018-05-14 MED ORDER — PANTOPRAZOLE SODIUM 40 MG PO TBEC: 40.0000 mg | DELAYED_RELEASE_TABLET | Freq: Two times a day (BID) | ORAL | 0 refills | Status: DC

## 2018-05-14 MED ORDER — ASPIRIN EC 81 MG PO TBEC
81.0000 mg | DELAYED_RELEASE_TABLET | Freq: Every day | ORAL | Status: DC
  Administered 2018-05-14: 81 mg via ORAL
  Filled 2018-05-14: qty 1

## 2018-05-14 MED ORDER — ASPIRIN 81 MG PO TBEC: 81.0000 mg | DELAYED_RELEASE_TABLET | Freq: Every day | ORAL | 2 refills | Status: DC

## 2018-05-14 NOTE — Consult Note (Signed)
Cardiology Consultation Note    Patient ID: Eric Steely., MRN: [id number], DOB/AGE: November 10, 1945 72 y.o. Admit date: 05/13/2018   Date of Consult: 05/14/2018 Primary Physician: [name], MD Primary Cardiologist: Dr. Nehemiah Massed  Chief Complaint: epigastric and chest pain Reason for Consultation: chest pain Requesting MD: Dr. Bridgett Larsson  HPI: Eric W Whitfield Dulay. is a 72 y.o. male with history of hypertension with recent orthostatic hypertension, epigastric pain as well and has hyperlipidemia.  He was recently admitted to [facility] in late September of this year with epigastric discomfort.  He had a mild elevated troponin at 0.08.  He was scheduled for outpatient functional study.  He returned to the emergency room with complaints of gastric discomfort and burping.  He states that this is been present for approximately 3 weeks.  He feels like he has a regurgitant taste in his mouth.  Eating and drinking causes the problem.  He denies diarrhea.  He denies nausea and vomiting.  Is trying a bland diet as well as Prilosec without improvement.  He has a GI evaluation pending however presented to the emergency room.  EKG again shows sinus rhythm/sinus bradycardia with incomplete right bundle branch block and a PVC.  No acute ischemic changes.  Again his serum troponin is borderline elevated at 0.09.  Has remained flat at this rate.  He denies any exertional component to his symptoms.  Past Medical History:  Diagnosis Date  . BPH (benign prostatic hypertrophy)   . Diastasis, muscle   . Gout   . HLD (hyperlipidemia)   . HTN (hypertension)   . Microscopic hematuria   . Obesity   . Prostatitis   . Psoriasis   . Renal mass   . Rosacea   . Shoulder pain   . Urinary retention       Surgical History:  Past Surgical History:  Procedure Laterality Date  . hydrostent removal       Home Meds: Prior to Admission medications   Medication Sig Start Date End Date Taking? Authorizing  Provider  amLODipine (NORVASC) 10 MG tablet Take 10 mg by mouth daily.  05/02/15 05/13/18 Yes [provider]  atorvastatin (LIPITOR) 20 MG tablet Take 20 mg by mouth daily.  10/31/15 05/13/18 Yes [provider]  finasteride (PROSCAR) 5 MG tablet TAKE (1) TABLET BY MOUTH EVERY DAY Patient taking differently: Take 5 mg by mouth daily.  12/25/17  Yes McGowan, Larene Beach A, PA-C  ipratropium (ATROVENT) 0.06 % nasal spray Place 2 sprays into the nose 3 (three) times daily as needed for rhinitis.    Yes [provider]  lisinopril (PRINIVIL,ZESTRIL) 20 MG tablet Take 20 mg by mouth 2 (two) times daily.  04/19/16 05/13/18 Yes [provider]  omega-3 acid ethyl esters (LOVAZA) 1 g capsule Take 1 g by mouth daily.   Yes [provider]  tamsulosin (FLOMAX) 0.4 MG CAPS capsule Take 0.4 mg by mouth daily.   Yes [provider]    Inpatient Medications:  . amLODipine  10 mg Oral Daily  . atorvastatin  20 mg Oral Daily  . docusate sodium  100 mg Oral BID  . enoxaparin (LOVENOX) injection  40 mg Subcutaneous Q24H  . finasteride  5 mg Oral Daily  . ipratropium  2 spray Nasal TID  . lisinopril  20 mg Oral BID  . sucralfate  1 g Oral TID WC & HS  . tamsulosin  0.4 mg Oral Daily   . sodium chloride Stopped (  05/13/18 2242)  . famotidine (PEPCID) IV Stopped (05/13/18 2232)    Allergies: No Known Allergies  Social History   Socioeconomic History  . Marital status: Married    Spouse name: Not on file  . Number of children: Not on file  . Years of education: Not on file  . Highest education level: Not on file  Occupational History  . Not on file  Social Needs  . Financial resource strain: Not on file  . Food insecurity:    Worry: Not on file    Inability: Not on file  . Transportation needs:    Medical: Not on file    Non-medical: Not on file  Tobacco Use  . Smoking status: Never Smoker  . Smokeless tobacco: Former Systems developer    Types: Chew   Substance and Sexual Activity  . Alcohol use: No    Alcohol/week: 0.0 standard drinks  . Drug use: No  . Sexual activity: Not on file  Lifestyle  . Physical activity:    Days per week: Not on file    Minutes per session: Not on file  . Stress: Not on file  Relationships  . Social connections:    Talks on phone: Not on file    Gets together: Not on file    Attends religious service: Not on file    Active member of club or organization: Not on file    Attends meetings of clubs or organizations: Not on file    Relationship status: Not on file  . Intimate partner violence:    Fear of current or ex partner: Not on file    Emotionally abused: Not on file    Physically abused: Not on file    Forced sexual activity: Not on file  Other Topics Concern  . Not on file  Social History Narrative  . Not on file     Family History  Problem Relation Age of Onset  . COPD Mother   . Heart disease Father   . Kidney disease Neg Hx   . Prostate cancer Neg Hx   . Kidney cancer Neg Hx   . Bladder Cancer Neg Hx      Review of Systems: A 12-system review of systems was performed and is negative except as noted in the HPI.  Labs: Recent Labs    05/13/18 1415 05/13/18 1845 05/13/18 2222 05/14/18 0438  TROPONINI 0.10* 0.08* 0.09* 0.09*   Lab Results  Component Value Date   WBC 8.1 05/14/2018   HGB 13.3 05/14/2018   HCT 39.5 05/14/2018   MCV 85.9 05/14/2018   PLT 240 05/14/2018    Recent Labs  Lab 05/14/18 0438  NA 143  K 3.4*  CL 109  CO2 24  BUN 17  CREATININE 0.84  CALCIUM 8.5*  GLUCOSE 92   Lab Results  Component Value Date   CHOL 118 04/30/2018   HDL 35 (L) 04/30/2018   LDLCALC 65 04/30/2018   TRIG 91 04/30/2018   No results found for: DDIMER  Radiology/Studies:  Dg Chest 2 View  Result Date: 04/29/2018 CLINICAL DATA:  Hypertension, dizzy spells. EXAM: CHEST - 2 VIEW COMPARISON:  None. FINDINGS: Heart and mediastinal contours are within normal limits. No  focal opacities or effusions. No acute bony abnormality. IMPRESSION: No active cardiopulmonary disease. Electronically Signed   By: Rolm Baptise M.D.   On: 04/29/2018 18:29    Wt Readings from Last 3 Encounters:  05/14/18 93.9 kg  04/29/18 95.7 kg  01/04/17 94.8  kg    EKG: Normal sinus rhythm with no ischemia  Physical Exam:  Blood pressure (!) 156/73, pulse 67, temperature 97.9 F (36.6 C), temperature source Oral, resp. rate 18, height 5\' 9"  (1.753 m), weight 93.9 kg, SpO2 97 %. Body mass index is 30.57 kg/m. General: Well developed, well nourished, in no acute distress. Head: Normocephalic, atraumatic, sclera non-icteric, no xanthomas, nares are without discharge.  Neck: Negative for carotid bruits. JVD not elevated. Lungs: Clear bilaterally to auscultation without wheezes, rales, or rhonchi. Breathing is unlabored. Heart: RRR with S1 S2. No murmurs, rubs, or gallops appreciated. Abdomen: Soft, non-tender.  Some epigastric discomfort when eating. Msk:  Strength and tone appear normal for age. Extremities: No clubbing or cyanosis. No edema.  Distal pedal pulses are 2+ and equal bilaterally. Neuro: Alert and oriented X 3. No facial asymmetry. No focal deficit. Moves all extremities spontaneously. Psych:  Responds to questions appropriately with a normal affect.     Assessment and Plan  72 year old male with history of hypertension  with recent complaints of epigastric discomfort with belching and flatulence as well as epigastric burning when eating or drinking.  Borderline troponin elevation.  This is not appear to be ACS.  Patient is scheduled for GI evaluation however presented again to the emergency room with epigastric discomfort.  We will plan to proceed with a functional study to evaluate for evidence of ischemia to guide further cardiac therapy.  He will need follow-up with GI to complete work-up.  Would not placed on IV heparin.  We will continue to treat with amlodipine at 10  mg daily, enteric-coated aspirin, atorvastatin 20 mg daily, lisinopril 20 mg daily.  Further recommendations after functional study is complete.  Signed, Teodoro Spray MD 05/14/2018, 7:48 AM Pager: [phone]

## 2018-05-14 NOTE — Discharge Summary (Signed)
[facility] NAME: Eric Dickson    MR#:  [id number]  DATE OF BIRTH:  [birth year]  DATE OF ADMISSION:  05/13/2018   ADMITTING PHYSICIAN: Epifanio Lesches, MD  DATE OF DISCHARGE: 05/14/2018  PRIMARY CARE PHYSICIAN: [name], MD   ADMISSION DIAGNOSIS:  Burping [R14.2] NSTEMI (non-ST elevated myocardial infarction) ([place]) [I21.4] DISCHARGE DIAGNOSIS:  Active Problems:   Heart burn  SECONDARY DIAGNOSIS:   Past Medical History:  Diagnosis Date  . BPH (benign prostatic hypertrophy)   . Diastasis, muscle   . Gout   . HLD (hyperlipidemia)   . HTN (hypertension)   . Microscopic hematuria   . Obesity   . Prostatitis   . Psoriasis   . Renal mass   . Rosacea   . Shoulder pain   . Urinary retention    HOSPITAL COURSE:  72 year old male patient with multiple medical problems of essential hypertension, BPH, recent admission for syncope, acute kidney injury supposed to have stress test next week comes in today because of abdominal pain, significant reflux problems with heartburn, acid taste in the mouth, ER physician did the EKG, troponins which are essentially within normal range but Dr. Ubaldo Glassing has been contacted by ER physician, #1 GERD, his main complaint is acid reflux with soreness in the mouth:  Started Protonix and Carafate, follow-up GI clinic this afternoon. 2.  Recent syncope, patient scheduled to have stress test next week but admitted to observation. Stress test is normal per Dr. Ubaldo Glassing. 3.  BPH: Continue Flomax 4.  Hyperlipidemia: Continue statins DISCHARGE CONDITIONS:  Stable, discharge to home today. CONSULTS OBTAINED:  Treatment Team:  Teodoro Spray, MD DRUG ALLERGIES:  No Known Allergies DISCHARGE MEDICATIONS:   Allergies as of 05/14/2018   No Known Allergies     Medication List    TAKE these medications   amLODipine 10 MG tablet Commonly known as:  NORVASC Take 10 mg by mouth daily.   aspirin 81 MG EC tablet Take 1 tablet (81 mg total) by mouth daily.   atorvastatin 20 MG tablet Commonly known as:  LIPITOR Take 20 mg by mouth daily.   finasteride 5 MG tablet Commonly known as:  PROSCAR TAKE (1) TABLET BY MOUTH EVERY DAY What changed:    how much to take  how to take this  when to take this  additional instructions   ipratropium 0.06 % nasal spray Commonly known as:  ATROVENT Place 2 sprays into the nose 3 (three) times daily as needed for rhinitis.   lisinopril 20 MG tablet Commonly known as:  PRINIVIL,ZESTRIL Take 20 mg by mouth 2 (two) times daily.   omega-3 acid ethyl esters 1 g capsule Commonly known as:  LOVAZA Take 1 g by mouth daily.   pantoprazole 40 MG tablet Commonly known as:  PROTONIX Take 1 tablet (40 mg total) by mouth 2 (two) times daily before a meal.   sucralfate 1 g tablet Commonly known as:  CARAFATE Take 1 tablet (1 g total) by mouth 4 (four) times daily -  with meals and at bedtime.   tamsulosin 0.4 MG Caps capsule Commonly known as:  FLOMAX Take 0.4 mg by mouth daily.        DISCHARGE INSTRUCTIONS:  See AVS.  If you experience worsening of your admission symptoms, develop shortness of breath, life threatening emergency, suicidal or homicidal thoughts you must seek medical attention immediately by calling 911 or calling your MD immediately  if  symptoms less severe.  You Must read complete instructions/literature along with all the possible adverse reactions/side effects for all the Medicines you take and that have been prescribed to you. Take any new Medicines after you have completely understood and accpet all the possible adverse reactions/side effects.   Please note  You were cared for by a hospitalist during your hospital stay. If you have any questions about your discharge medications or the care you received while you were in the hospital after you are discharged, you can call the unit and asked to speak with  the hospitalist on call if the hospitalist that took care of you is not available. Once you are discharged, your primary care physician will handle any further medical issues. Please note that NO REFILLS for any discharge medications will be authorized once you are discharged, as it is imperative that you return to your primary care physician (or establish a relationship with a primary care physician if you do not have one) for your aftercare needs so that they can reassess your need for medications and monitor your lab values.    On the day of Discharge:  VITAL SIGNS:  Blood pressure (!) 144/66, pulse 69, temperature 97.8 F (36.6 C), temperature source Oral, resp. rate 18, height 5\' 9"  (1.753 m), weight 93.9 kg, SpO2 97 %. PHYSICAL EXAMINATION:  GENERAL:  72 y.o.-year-old patient lying in the bed with no acute distress.  EYES: Pupils equal, round, reactive to light and accommodation. No scleral icterus. Extraocular muscles intact.  HEENT: Head atraumatic, normocephalic. Oropharynx and nasopharynx clear.  NECK:  Supple, no jugular venous distention. No thyroid enlargement, no tenderness.  LUNGS: Normal breath sounds bilaterally, no wheezing, rales,rhonchi or crepitation. No use of accessory muscles of respiration.  CARDIOVASCULAR: S1, S2 normal. No murmurs, rubs, or gallops.  ABDOMEN: Soft, non-tender, non-distended. Bowel sounds present. No organomegaly or mass.  EXTREMITIES: No pedal edema, cyanosis, or clubbing.  NEUROLOGIC: Cranial nerves II through XII are intact. Muscle strength 5/5 in all extremities. Sensation intact. Gait not checked.  PSYCHIATRIC: The patient is alert and oriented x 3.  SKIN: No obvious rash, lesion, or ulcer.  DATA REVIEW:   CBC Recent Labs  Lab 05/14/18 0438  WBC 8.1  HGB 13.3  HCT 39.5  PLT 240    Chemistries  Recent Labs  Lab 05/14/18 0438  NA 143  K 3.4*  CL 109  CO2 24  GLUCOSE 92  BUN 17  CREATININE 0.84  CALCIUM 8.5*  MG 2.2      Microbiology Results  Results for orders placed or performed in visit on 04/03/16  Microscopic Examination     Status: Abnormal   Collection Time: 04/03/16 11:39 AM  Result Value Ref Range Status   WBC, UA 0-5 0 - 5 /hpf Final   RBC, UA 3-10 (A) 0 - 2 /hpf Final   Epithelial Cells (non renal) 0-10 0 - 10 /hpf Final   Bacteria, UA None seen None seen/Few Final    RADIOLOGY:  Nm Myocar Multi W/spect W/wall Motion / Ef  Result Date: 05/14/2018  The study is normal.  This is a low risk study.  The left ventricular ejection fraction is normal (55-65%).  There was no ST segment deviation noted during stress.  Negative lexiscan stress. Normal lv function. Low risk study     Management plans discussed with the patient, his wife and they are in agreement.  CODE STATUS: Full Code   TOTAL TIME TAKING CARE OF THIS  PATIENT: 28 minutes.    Demetrios Loll M.D on 05/14/2018 at 2:58 PM  Between 7am to 6pm - Pager - [phone]  After 6pm go to [email or web address] - Proofreader  [facility]  Office  [phone]  CC: Primary care physician; [name], MD   Note: This dictation was prepared with Dragon dictation along with smaller phrase technology. Any transcriptional errors that result from this process are unintentional.

## 2018-05-14 NOTE — Plan of Care (Signed)
  Problem: Activity: Goal: Risk for activity intolerance will decrease Outcome: Progressing Note:  Up in room independently, tolerating well., wife at bedside   Problem: Nutrition: Goal: Adequate nutrition will be maintained Outcome: Progressing Note:  Pt does have acid reflux & states is unable to eat/drink very much with that, pt also states he had a GI appointment today to figure out what was going on with his acid reflux.   Problem: Coping: Goal: Level of anxiety will decrease Outcome: Progressing   Problem: Pain Managment: Goal: General experience of comfort will improve Outcome: Progressing Note:  No complaints of pain this shift   Problem: Safety: Goal: Ability to remain free from injury will improve Outcome: Progressing   Problem: Skin Integrity: Goal: Risk for impaired skin integrity will decrease Outcome: Progressing   Problem: Education: Goal: Knowledge of General Education information will improve Description Including pain rating scale, medication(s)/side effects and non-pharmacologic comfort measures Outcome: Completed/Met

## 2018-05-14 NOTE — Care Management Note (Signed)
Case Management Note  Patient Details  Name: Eric Dickson. MRN: [id number] Date of Birth: [birth year]  Subjective/Objective:        Independent in all adls, denies issues accessing medical care, obtaining medications or with transportation.  Current with PCP.  No discharge needs identified at present by care manager or members of care team              Action/Plan:   Expected Discharge Date:  05/14/18               Expected Discharge Plan:  Home/Self Care  In-House Referral:     Discharge planning Services     Post Acute Care Choice:    Choice offered to:     DME Arranged:    DME Agency:     HH Arranged:    [facility] Agency:     Status of Service:  Completed, signed off  If discussed at [employer] of Stay Meetings, dates discussed:    Additional Comments:  Elza Rafter, RN 05/14/2018, 2:49 PM

## 2018-05-14 NOTE — Plan of Care (Signed)

## 2018-05-15 ENCOUNTER — Other Ambulatory Visit: Payer: Self-pay | Admitting: Gastroenterology

## 2018-05-15 DIAGNOSIS — R1310 Dysphagia, unspecified: Secondary | ICD-10-CM

## 2018-05-21 ENCOUNTER — Ambulatory Visit
Admission: RE | Admit: 2018-05-21 | Discharge: 2018-05-21 | Disposition: A | Discharge status: Home / Self Care | Payer: Medicare Other | Source: Ambulatory Visit | Attending: Gastroenterology | Admitting: Gastroenterology

## 2018-05-21 DIAGNOSIS — K449 Diaphragmatic hernia without obstruction or gangrene: Secondary | ICD-10-CM | POA: Diagnosis not present

## 2018-05-21 DIAGNOSIS — R1310 Dysphagia, unspecified: Secondary | ICD-10-CM | POA: Diagnosis not present

## 2018-05-21 DIAGNOSIS — K219 Gastro-esophageal reflux disease without esophagitis: Secondary | ICD-10-CM | POA: Insufficient documentation

## 2018-05-21 DIAGNOSIS — K228 Other specified diseases of esophagus: Secondary | ICD-10-CM | POA: Insufficient documentation

## 2018-05-22 DIAGNOSIS — R072 Precordial pain: Secondary | ICD-10-CM | POA: Insufficient documentation

## 2018-05-22 DIAGNOSIS — I493 Ventricular premature depolarization: Secondary | ICD-10-CM | POA: Insufficient documentation

## 2018-06-27 ENCOUNTER — Other Ambulatory Visit: Payer: Self-pay | Admitting: Urology

## 2018-06-27 DIAGNOSIS — N401 Enlarged prostate with lower urinary tract symptoms: Secondary | ICD-10-CM

## 2018-08-11 ENCOUNTER — Encounter: Admission: RE | Payer: Self-pay | Source: Home / Self Care

## 2018-08-11 ENCOUNTER — Ambulatory Visit: Admission: RE | Admit: 2018-08-11 | Payer: Medicare Other | Source: Home / Self Care | Admitting: Gastroenterology

## 2018-08-11 SURGERY — EGD (ESOPHAGOGASTRODUODENOSCOPY)
Anesthesia: General

## 2018-08-17 NOTE — Progress Notes (Addendum)
08/18/2018 2:29 PM   Eric Dickson. [birth year] [id number]  Referring provider: [name], [name], [place] [address]  Chief Complaint  Patient presents with  . Follow-up    HPI: 73 yo WM with BPH with LU TS who presents for follow up.    BPH WITH LUTS His IPSS score today is 3, which is mild lower urinary tract symptomatology. He is mostly satisfied with his quality life due to his urinary symptoms. His PVR is 58 mL.  His previous I PSS 11/2.  His previous PVR was 93 mL.  He has no major complaints at this time.  He currently taking tamsulosin 0.4 mg daily and finasteride 5 mg daily.    Patient denies any gross hematuria, dysuria or suprapubic/flank pain.  Patient denies any fevers, chills, nausea or vomiting.  IPSS    Row Name 08/18/18 1400         International Prostate Symptom Score   How often have you had the sensation of not emptying your bladder?  Less than 1 in 5     How often have you had to urinate less than every two hours?  Not at All     How often have you found you stopped and started again several times when you urinated?  Less than 1 in 5 times     How often have you found it difficult to postpone urination?  Not at All     How often have you had a weak urinary stream?  Not at All     How often have you had to strain to start urination?  Not at All     How many times did you typically get up at night to urinate?  1 Time     Total IPSS Score  3       Quality of Life due to urinary symptoms   If you were to spend the rest of your life with your urinary condition just the way it is now how would you feel about that?  Mostly Satisfied        Score:  1-7 Mild 8-19 Moderate 20-35 Severe   PMH: Past Medical History:  Diagnosis Date  . BPH (benign prostatic hypertrophy)   . Diastasis, muscle   . Gout   . HLD (hyperlipidemia)   . HTN (hypertension)   . Microscopic hematuria   . Obesity   . Prostatitis   .  Psoriasis   . Renal mass   . Rosacea   . Shoulder pain   . Urinary retention     Surgical History: Past Surgical History:  Procedure Laterality Date  . hydrostent removal      Home Medications:  Allergies as of 08/18/2018   No Known Allergies     Medication List       Accurate as of August 18, 2018  2:29 PM. Always use your most recent med list.        amLODipine 10 MG tablet Commonly known as:  NORVASC Take 10 mg by mouth daily.   aspirin 81 MG EC tablet Take 1 tablet (81 mg total) by mouth daily.   atorvastatin 20 MG tablet Commonly known as:  LIPITOR Take 20 mg by mouth daily.   citalopram 10 MG tablet Commonly known as:  CELEXA Take by mouth.   clindamycin 1 % gel Commonly known as:  CLINDAGEL Apply topically.   finasteride 5 MG tablet Commonly known as:  PROSCAR TAKE (  1) TABLET BY MOUTH EVERY DAY   ipratropium 0.06 % nasal spray Commonly known as:  ATROVENT Place 2 sprays into the nose 3 (three) times daily as needed for rhinitis.   lisinopril 20 MG tablet Commonly known as:  PRINIVIL,ZESTRIL Take 20 mg by mouth 2 (two) times daily.   omega-3 acid ethyl esters 1 g capsule Commonly known as:  LOVAZA Take 1 g by mouth daily.   pantoprazole 40 MG tablet Commonly known as:  PROTONIX Take 1 tablet (40 mg total) by mouth 2 (two) times daily before a meal.   sucralfate 1 g tablet Commonly known as:  CARAFATE Take 1 tablet (1 g total) by mouth 4 (four) times daily -  with meals and at bedtime.   tamsulosin 0.4 MG Caps capsule Commonly known as:  FLOMAX Take 1 capsule (0.4 mg total) by mouth daily.       Allergies: No Known Allergies  Family History: Family History  Problem Relation Age of Onset  . COPD Mother   . Heart disease Father   . Kidney disease Neg Hx   . Prostate cancer Neg Hx   . Kidney cancer Neg Hx   . Bladder Cancer Neg Hx     Social History:  reports that he has never smoked. He quit smokeless tobacco use about 27  years ago.  His smokeless tobacco use included chew. He reports that he does not drink alcohol or use drugs.  ROS: UROLOGY Frequent Urination?: No Hard to postpone urination?: No Burning/pain with urination?: No Get up at night to urinate?: No Leakage of urine?: No Urine stream starts and stops?: No Trouble starting stream?: No Do you have to strain to urinate?: No Blood in urine?: No Urinary tract infection?: No Sexually transmitted disease?: No Injury to kidneys or bladder?: No Painful intercourse?: No Weak stream?: No Erection problems?: No Penile pain?: No  Gastrointestinal Nausea?: No Vomiting?: No Indigestion/heartburn?: No Diarrhea?: No Constipation?: No  Constitutional Fever: No Night sweats?: No Weight loss?: No Fatigue?: No  Skin Skin rash/lesions?: No Itching?: No  Eyes Blurred vision?: No Double vision?: No  Ears/Nose/Throat Sore throat?: No Sinus problems?: No  Hematologic/Lymphatic Swollen glands?: No Easy bruising?: No  Cardiovascular Leg swelling?: No Chest pain?: No  Respiratory Cough?: No Shortness of breath?: No  Endocrine Excessive thirst?: No  Musculoskeletal Back pain?: No Joint pain?: No  Neurological Headaches?: No Dizziness?: No  Psychologic Depression?: No Anxiety?: No  Physical Exam: BP (!) 208/109   Pulse 82   Temp (!) 96.3 F (35.7 C) (Oral)   Resp 16   Ht 5\' 9"  (1.753 m)   Wt 218 lb (98.9 kg)   BMI 32.19 kg/m   Constitutional:  Well nourished. Alert and oriented, No acute distress. HEENT: [place] AT, moist mucus membranes.  Trachea midline, no masses. Cardiovascular: No clubbing, cyanosis, or edema. Respiratory: Normal respiratory effort, no increased work of breathing. GI: Abdomen is soft, non tender, non distended, no abdominal masses. Liver and spleen not palpable.  No hernias appreciated.  Stool sample for occult testing is not indicated.   GU: No CVA tenderness.  No bladder fullness or masses.   Patient with circumcised phallus. Urethral meatus is patent.  No penile discharge. No penile lesions or rashes. Scrotum without lesions, cysts, rashes and/or edema.  Testicles are located scrotally bilaterally. No masses are appreciated in the testicles. Left and right epididymis are normal. Rectal: Patient with  normal sphincter tone. Anus and perineum without scarring or rashes. No  rectal masses are appreciated. Prostate is approximately 45 grams, no nodules are appreciated.  Skin: No rashes, bruises or suspicious lesions. Lymph: No cervical or inguinal adenopathy. Neurologic: Grossly intact, no focal deficits, moving all 4 extremities. Psychiatric: Normal mood and affect.   Laboratory Data: PSA History    1.5 ng/mL on 07/04/2015  1.0 ng/mL on 12/27/2015  0.79 ng/mL on 01/02/2017  0.82 in 08/2018 I have reviewed the labs.   Assessment & Plan:    1. BPH with LUTS  - IPSS score is 3/2, it is improved  - Continue conservative management, avoiding bladder irritants and timed voiding's  - Continue tamsulosin 0.4 mg daily and finasteride 5 mg daily; refills given  - RTC in 12 months for IPSS, PSA and exam   2. Left renal mass  - Dromedary hump, no further intervention needed   3. HTN  - expressed my concerns regarding his elevated BP placing him at risk for stroke/heart attack and advised to contact PCP-he is not having CP, HA or visual disturbance at this time   - he stated he will check it at home and if it remains elevated, he will contact Dr. Kym Groom    Return in about 1 year (around 08/19/2019) for IPSS, exam and PVR.  Zara Council, PA-C  [facility] [address] [place], [place] [address] [phone]

## 2018-08-18 ENCOUNTER — Ambulatory Visit (INDEPENDENT_AMBULATORY_CARE_PROVIDER_SITE_OTHER): Payer: Medicare Other | Admitting: Urology

## 2018-08-18 ENCOUNTER — Encounter: Payer: Self-pay | Admitting: Urology

## 2018-08-18 ENCOUNTER — Other Ambulatory Visit: Payer: Self-pay

## 2018-08-18 VITALS — BP 208/109 | HR 82 | Temp 96.3°F | Resp 16 | Ht 69.0 in | Wt 218.0 lb

## 2018-08-18 DIAGNOSIS — N401 Enlarged prostate with lower urinary tract symptoms: Secondary | ICD-10-CM

## 2018-08-18 DIAGNOSIS — I10 Essential (primary) hypertension: Secondary | ICD-10-CM | POA: Diagnosis not present

## 2018-08-18 DIAGNOSIS — N138 Other obstructive and reflux uropathy: Secondary | ICD-10-CM | POA: Diagnosis not present

## 2018-08-18 LAB — BLADDER SCAN AMB NON-IMAGING: Scan Result: 58

## 2018-08-18 MED ORDER — FINASTERIDE 5 MG PO TABS: ORAL_TABLET | ORAL | 3 refills | Status: DC

## 2018-08-18 MED ORDER — TAMSULOSIN HCL 0.4 MG PO CAPS: 0.4000 mg | ORAL_CAPSULE | Freq: Every day | ORAL | 3 refills | Status: DC

## 2018-08-22 ENCOUNTER — Other Ambulatory Visit: Payer: Self-pay

## 2018-08-22 ENCOUNTER — Emergency Department
Admission: EM | Admit: 2018-08-22 | Discharge: 2018-08-22 | Disposition: A | Discharge status: Home / Self Care | Payer: Medicare Other | Attending: Emergency Medicine | Admitting: Emergency Medicine

## 2018-08-22 ENCOUNTER — Emergency Department: Payer: Medicare Other

## 2018-08-22 ENCOUNTER — Encounter: Payer: Self-pay | Admitting: Emergency Medicine

## 2018-08-22 DIAGNOSIS — Z87891 Personal history of nicotine dependence: Secondary | ICD-10-CM | POA: Insufficient documentation

## 2018-08-22 DIAGNOSIS — I10 Essential (primary) hypertension: Secondary | ICD-10-CM | POA: Diagnosis present

## 2018-08-22 DIAGNOSIS — E876 Hypokalemia: Secondary | ICD-10-CM | POA: Diagnosis not present

## 2018-08-22 DIAGNOSIS — Z79899 Other long term (current) drug therapy: Secondary | ICD-10-CM | POA: Diagnosis not present

## 2018-08-22 DIAGNOSIS — E860 Dehydration: Secondary | ICD-10-CM | POA: Diagnosis not present

## 2018-08-22 LAB — CBC
HEMATOCRIT: 46.5 % (ref 39.0–52.0)
Hemoglobin: 16.1 g/dL (ref 13.0–17.0)
MCH: 28.5 pg (ref 26.0–34.0)
MCHC: 34.6 g/dL (ref 30.0–36.0)
MCV: 82.4 fL (ref 80.0–100.0)
Platelets: 307 10*3/uL (ref 150–400)
RBC: 5.64 MIL/uL (ref 4.22–5.81)
RDW: 12.6 % (ref 11.5–15.5)
WBC: 12.7 10*3/uL — ABNORMAL HIGH (ref 4.0–10.5)
nRBC: 0 % (ref 0.0–0.2)

## 2018-08-22 LAB — BASIC METABOLIC PANEL
Anion gap: 11 (ref 5–15)
BUN: 37 mg/dL — AB (ref 8–23)
CALCIUM: 8.9 mg/dL (ref 8.9–10.3)
CO2: 20 mmol/L — ABNORMAL LOW (ref 22–32)
CREATININE: 1.29 mg/dL — AB (ref 0.61–1.24)
Chloride: 105 mmol/L (ref 98–111)
GFR calc Af Amer: >60 mL/min (ref >60)
GFR calc non Af Amer: 55 mL/min — ABNORMAL LOW (ref >60)
GLUCOSE: 123 mg/dL — AB (ref 70–99)
Potassium: 3.1 mmol/L — ABNORMAL LOW (ref 3.5–5.1)
Sodium: 136 mmol/L (ref 135–145)

## 2018-08-22 LAB — TROPONIN I: Troponin I: 0.09 ng/mL (ref <0.03)

## 2018-08-22 MED ORDER — AMLODIPINE BESYLATE 5 MG PO TABS
10.0000 mg | ORAL_TABLET | Freq: Once | ORAL | Status: AC
  Administered 2018-08-22: 10 mg via ORAL
  Filled 2018-08-22: qty 2

## 2018-08-22 MED ORDER — CYCLOBENZAPRINE HCL 5 MG PO TABS: 5.0000 mg | ORAL_TABLET | Freq: Three times a day (TID) | ORAL | 0 refills | Status: DC | PRN

## 2018-08-22 MED ORDER — MAGNESIUM SULFATE 2 GM/50ML IV SOLN
2.0000 g | Freq: Once | INTRAVENOUS | Status: AC
  Administered 2018-08-22: 2 g via INTRAVENOUS
  Filled 2018-08-22: qty 50

## 2018-08-22 MED ORDER — POTASSIUM CHLORIDE CRYS ER 20 MEQ PO TBCR: 20.0000 meq | EXTENDED_RELEASE_TABLET | Freq: Every day | ORAL | 1 refills | Status: DC

## 2018-08-22 MED ORDER — POTASSIUM CHLORIDE CRYS ER 20 MEQ PO TBCR
40.0000 meq | EXTENDED_RELEASE_TABLET | Freq: Once | ORAL | Status: AC
  Administered 2018-08-22: 40 meq via ORAL
  Filled 2018-08-22: qty 2

## 2018-08-22 MED ORDER — HYDROCHLOROTHIAZIDE 25 MG PO TABS: 25.0000 mg | ORAL_TABLET | Freq: Every day | ORAL | Status: DC

## 2018-08-22 MED ORDER — SODIUM CHLORIDE 0.9 % IV BOLUS
1000.0000 mL | Freq: Once | INTRAVENOUS | Status: AC
  Administered 2018-08-22: 1000 mL via INTRAVENOUS

## 2018-08-22 NOTE — ED Triage Notes (Signed)
Pt here with c/o [facility] was seen in Dr office on Wed, CXR negative per pt, states the Dr feels his HTN is making him feel SHOB, pt states hiatal hernia giving him issues as well, increasing burping and weakness recently, appears in NAD.

## 2018-08-22 NOTE — ED Notes (Signed)
Pt verbalized understanding of discharge instructions. NAD at this time. 

## 2018-08-22 NOTE — ED Provider Notes (Signed)
[facility] Emergency Department Provider Note  ____________________________________________  Time seen: Approximately 4:10 PM  I have reviewed the triage vital signs and the nursing notes.   HISTORY  Chief Complaint Shortness of Breath   HPI Michall W Tollie Canada. is a 73 y.o. male with a history of BPH, hypertension, hyperlipidemia, psoriasis who presents for evaluation of high blood pressure.  Patient reports that 2 months ago he was taken off metoprolol by his cardiologist for dizzy spells.  Since then his blood pressure has been very hard to control.  He saw his urologist 4 days ago for a prostate exam and he was found to have blood pressure in the 200s.  He followed up with his doctor 2 days later and his blood pressure was in the 180s.  His doctor increase his amlodipine from 5-7.5mg  and added hydrochlorothiazide.  Patient reports that his blood pressure was again elevated this morning in the 180s after taking both lisinopril and the new hydrochlorothiazide.  He usually takes amlodipine in the evenings.  Patient reports having shortness of breath but his description means that occasionally throughout the day he will feel the need of taking a deep breath.  He does not have shortness of breath or dyspnea on exertion or chest pain or cough or any other respiratory symptoms.  He reports that he has a hiatal hernia and that causes him a lot of grief.  He reports lots of gas and belching every time he eats or drinks anything.  He reports that he has been eating or drinking normally for several weeks because of all the gas and belching.  He has been referred to see a GI specialist and his appointment is coming up soon.   Past Medical History:  Diagnosis Date  . BPH (benign prostatic hypertrophy)   . Diastasis, muscle   . Gout   . HLD (hyperlipidemia)   . HTN (hypertension)   . Microscopic hematuria   . Obesity   . Prostatitis   . Psoriasis   . Renal mass   .  Rosacea   . Shoulder pain   . Urinary retention     Patient Active Problem List   Diagnosis Date Noted  . 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 Precordial pain 05/22/2018  . Heart burn 05/13/2018  . Orthostatic hypotension 05/06/2018  . Near syncope 04/29/2018  . Enlarged heart 05/14/2017  . BPH associated with nocturia 10/30/2016  . Need for vaccination 10/30/2016  . Other acne 10/30/2016  . Renal mass 05/08/2016  . Incomplete bladder emptying 04/03/2016  . Renal lesion 07/04/2015  . H/O urinary retention 07/04/2015  . Elevated PSA 07/04/2015  . Benign essential hypertension 02/27/2011  . Diastasis of muscle 02/27/2011  . Gout, unspecified 02/27/2011  . Mixed hyperlipidemia 02/27/2011  . Other psoriasis 02/27/2011  . Prostatitis 02/27/2011  . Shoulder joint pain 02/27/2011  . Rosacea 02/27/2011    Past Surgical History:  Procedure Laterality Date  . hydrostent removal      Prior to Admission medications   Medication Sig Start Date End Date Taking? Authorizing Provider  amLODipine (NORVASC) 10 MG tablet Take 10 mg by mouth daily.  05/02/15 05/13/18  [provider]  aspirin EC 81 MG EC tablet Take 1 tablet (81 mg total) by mouth daily. Patient not taking: Reported on 08/18/2018 05/14/18   Demetrios Loll, MD  atorvastatin (LIPITOR) 20 MG tablet Take 20 mg by mouth daily.  10/31/15 05/13/18  [provider]  citalopram (CELEXA) 10  MG tablet Take by mouth. 08/13/18 08/13/19  [provider]  clindamycin (CLINDAGEL) 1 % gel Apply topically. 08/13/18 08/13/19  [provider]  finasteride (PROSCAR) 5 MG tablet TAKE (1) TABLET BY MOUTH EVERY DAY 08/18/18   McGowan, Larene Beach A, PA-C  ipratropium (ATROVENT) 0.06 % nasal spray Place 2 sprays into the nose 3 (three) times daily as needed for rhinitis.     [provider]  lisinopril (PRINIVIL,ZESTRIL) 20 MG tablet Take 20 mg by mouth 2 (two) times daily.  04/19/16 05/13/18  [provider]  omega-3 acid  ethyl esters (LOVAZA) 1 g capsule Take 1 g by mouth daily.    [provider]  pantoprazole (PROTONIX) 40 MG tablet Take 1 tablet (40 mg total) by mouth 2 (two) times daily before a meal. Patient not taking: Reported on 08/18/2018 05/14/18 05/14/19  Demetrios Loll, MD  potassium chloride SA (K-DUR,KLOR-CON) 20 MEQ tablet Take 1 tablet (20 mEq total) by mouth daily. 08/22/18   Rudene Re, MD  sucralfate (CARAFATE) 1 g tablet Take 1 tablet (1 g total) by mouth 4 (four) times daily -  with meals and at bedtime. Patient not taking: Reported on 08/18/2018 05/14/18   Demetrios Loll, MD  tamsulosin (FLOMAX) 0.4 MG CAPS capsule Take 1 capsule (0.4 mg total) by mouth daily. 08/18/18   Zara Council A, PA-C    Allergies Patient has no known allergies.  Family History  Problem Relation Age of Onset  . COPD Mother   . Heart disease Father   . Kidney disease Neg Hx   . Prostate cancer Neg Hx   . Kidney cancer Neg Hx   . Bladder Cancer Neg Hx     Social History Social History   Tobacco Use  . Smoking status: Never Smoker  . Smokeless tobacco: Former Systems developer    Types: Chew  Substance Use Topics  . Alcohol use: No    Alcohol/week: 0.0 standard drinks  . Drug use: No    Review of Systems  Constitutional: Negative for fever. Eyes: Negative for visual changes. ENT: Negative for sore throat. Neck: No neck pain  Cardiovascular: Negative for chest pain. Respiratory: Negative for shortness of breath. Gastrointestinal: Negative for abdominal pain, vomiting or diarrhea. Genitourinary: Negative for dysuria. Musculoskeletal: Negative for back pain. Skin: Negative for rash. Neurological: Negative for headaches, weakness or numbness. Psych: No SI or HI  ____________________________________________   PHYSICAL EXAM:  VITAL SIGNS: ED Triage Vitals  Enc Vitals Group     BP 08/22/18 1312 (!) 166/90     Pulse Rate 08/22/18 1312 (!) 113     Resp 08/22/18 1312 18     Temp 08/22/18 1554 97.7  F (36.5 C)     Temp Source 08/22/18 1554 Oral     SpO2 08/22/18 1312 97 %     Weight 08/22/18 1313 210 lb (95.3 kg)     Height 08/22/18 1313 5\' 9"  (1.753 m)     Head Circumference --      Peak Flow --      Pain Score 08/22/18 1312 0     Pain Loc --      Pain Edu? --      Excl. in [place]? --     Constitutional: Alert and oriented. Well appearing and in no apparent distress. HEENT:      Head: Normocephalic and atraumatic.         Eyes: Conjunctivae are normal. Sclera is non-icteric.       Mouth/Throat:  Mucous membranes are dry.       Neck: Supple with no signs of meningismus. Cardiovascular: Tachycardic with regular rhythm. No murmurs, gallops, or rubs. 2+ symmetrical distal pulses are present in all extremities. No JVD. Respiratory: Normal respiratory effort. Lungs are clear to auscultation bilaterally. No wheezes, crackles, or rhonchi.  Gastrointestinal: Soft, non tender, and non distended with positive bowel sounds. No rebound or guarding. Musculoskeletal: Nontender with normal range of motion in all extremities. No edema, cyanosis, or erythema of extremities. Neurologic: Normal speech and language. Face is symmetric. Moving all extremities. No gross focal neurologic deficits are appreciated. Skin: Skin is warm, dry and intact. No rash noted. Psychiatric: Mood and affect are normal. Speech and behavior are normal.  ____________________________________________   LABS (all labs ordered are listed, but only abnormal results are displayed)  Labs Reviewed  CBC - Abnormal; Notable for the following components:      Result Value   WBC 12.7 (*)    All other components within normal limits  BASIC METABOLIC PANEL - Abnormal; Notable for the following components:   Potassium 3.1 (*)    CO2 20 (*)    Glucose, Bld 123 (*)    BUN 37 (*)    Creatinine, Ser 1.29 (*)    GFR calc non Af Amer 55 (*)    All other components within normal limits  TROPONIN I - Abnormal; Notable for the  following components:   Troponin I 0.09 (*)    All other components within normal limits   ____________________________________________  EKG  ED ECG REPORT I, Rudene Re, the attending physician, personally viewed and interpreted this ECG.  Sinus tachycardia with several PACs, rate of 103, normal QTC, normal axis, no ST elevations or depressions, Q waves in inferior leads.  New when compared to prior.  18:32 - NSR, rate 90, 1st degree AV block, no STE or depressions. No change from prior ____________________________________________  RADIOLOGY  I have personally reviewed the images performed during this visit and I agree with the Radiologist's read.   Interpretation by Radiologist:  Dg Chest 2 View  Result Date: 08/22/2018 CLINICAL DATA:  Shortness of breath EXAM: CHEST - 2 VIEW COMPARISON:  April 29, 2018 FINDINGS: The heart size and mediastinal contours are within normal limits. Both lungs are clear. The visualized skeletal structures are stable. IMPRESSION: No active cardiopulmonary disease. Electronically Signed   By: Abelardo Diesel M.D.   On: 08/22/2018 13:40     ____________________________________________   PROCEDURES  Procedure(s) performed: None Procedures Critical Care performed:  None ____________________________________________   INITIAL IMPRESSION / ASSESSMENT AND PLAN / ED COURSE   73 y.o. male with a history of BPH, hypertension, hyperlipidemia, psoriasis who presents for evaluation of high blood pressure. No evidence of end organ injury. Labs showing hypokalemia potassium 3.1.  EKG shows a lot of frequent PACs. Will supplement K and mag. Electrolytes probably low due to decreased PO intake due to hiatal hernia, gas and belching. No ischemic changes, troponin is at baseline, no CP or SOB. Labs also showing bump on creatinine concerning for dehydration, IVF have been given. Will give amlodipine 10mg  and reassess BP.  Clinical Course as of Aug 22 1899    Fri Aug 22, 2018  1856 BP trending dow. Repeat EKG after mag and K supplementation showing no further PACs, norml with no dysrhythmias or ischemia. After IVF patient feels markedly improved. Will dc home on K supplementation, BP diary, and close f/u with cardiologist. Discussed standard return  precautions with patient and his wife.     [CV]    Clinical Course User Index [CV] Alfred Levins [place], MD     As part of my medical decision making, I reviewed the following data within the [place] notes reviewed and incorporated, Labs reviewed , EKG interpreted , Old EKG reviewed, Old chart reviewed, Radiograph reviewed , Notes from prior ED visits and [place] Controlled Substance Database    Pertinent labs & imaging results that were available during my care of the patient were reviewed by me and considered in my medical decision making (see chart for details).    ____________________________________________   FINAL CLINICAL IMPRESSION(S) / ED DIAGNOSES  Final diagnoses:  Essential hypertension  Hypokalemia  Dehydration      NEW MEDICATIONS STARTED DURING THIS VISIT:  ED Discharge Orders         Ordered    potassium chloride SA (K-DUR,KLOR-CON) 20 MEQ tablet  Daily     08/22/18 1858           Note:  This document was prepared using Dragon voice recognition software and may include unintentional dictation errors.    Alfred Levins, [place], MD 08/22/18 1902

## 2018-08-22 NOTE — Discharge Instructions (Addendum)
For your blood pressure take amlodipine 10mg  a day, lisinopril 20mg  twice a day, and hydrochlorothiazide 25mg  daily. Keep a blood pressure diary and follow up with your cardiologist or doctor in the beginning of next week. Take one daily pill of potassium as prescribed. Return to the ER for chest pain, shortness of breath, or any new symptoms concerning to you.

## 2018-08-26 ENCOUNTER — Other Ambulatory Visit: Payer: Self-pay | Admitting: Gastroenterology

## 2018-08-26 DIAGNOSIS — R634 Abnormal weight loss: Secondary | ICD-10-CM

## 2018-08-26 DIAGNOSIS — R109 Unspecified abdominal pain: Secondary | ICD-10-CM

## 2018-08-28 ENCOUNTER — Ambulatory Visit
Admission: RE | Admit: 2018-08-28 | Discharge: 2018-08-28 | Disposition: A | Discharge status: Home / Self Care | Payer: Medicare Other | Source: Ambulatory Visit | Attending: Gastroenterology | Admitting: Gastroenterology

## 2018-08-28 DIAGNOSIS — R109 Unspecified abdominal pain: Secondary | ICD-10-CM | POA: Insufficient documentation

## 2018-08-28 DIAGNOSIS — R634 Abnormal weight loss: Secondary | ICD-10-CM | POA: Diagnosis present

## 2018-08-28 MED ORDER — IOHEXOL 300 MG/ML  SOLN
100.0000 mL | Freq: Once | INTRAMUSCULAR | Status: AC | PRN
  Administered 2018-08-28: 100 mL via INTRAVENOUS

## 2018-10-06 ENCOUNTER — Encounter: Payer: Self-pay | Admitting: *Deleted

## 2018-10-07 ENCOUNTER — Ambulatory Visit: Payer: Medicare Other | Admitting: Anesthesiology

## 2018-10-07 ENCOUNTER — Ambulatory Visit
Admission: RE | Admit: 2018-10-07 | Discharge: 2018-10-07 | Disposition: A | Discharge status: Home / Self Care | Payer: Medicare Other | Attending: Internal Medicine | Admitting: Internal Medicine

## 2018-10-07 ENCOUNTER — Encounter
Admission: RE | Disposition: A | Discharge status: Home / Self Care | Payer: Self-pay | Source: Home / Self Care | Attending: Internal Medicine

## 2018-10-07 DIAGNOSIS — D125 Benign neoplasm of sigmoid colon: Secondary | ICD-10-CM | POA: Diagnosis not present

## 2018-10-07 DIAGNOSIS — K641 Second degree hemorrhoids: Secondary | ICD-10-CM | POA: Insufficient documentation

## 2018-10-07 DIAGNOSIS — M6200 Separation of muscle (nontraumatic), unspecified site: Secondary | ICD-10-CM | POA: Diagnosis not present

## 2018-10-07 DIAGNOSIS — E669 Obesity, unspecified: Secondary | ICD-10-CM | POA: Diagnosis not present

## 2018-10-07 DIAGNOSIS — Z6831 Body mass index (BMI) 31.0-31.9, adult: Secondary | ICD-10-CM | POA: Insufficient documentation

## 2018-10-07 DIAGNOSIS — K449 Diaphragmatic hernia without obstruction or gangrene: Secondary | ICD-10-CM | POA: Insufficient documentation

## 2018-10-07 DIAGNOSIS — R1084 Generalized abdominal pain: Secondary | ICD-10-CM | POA: Diagnosis present

## 2018-10-07 DIAGNOSIS — K5730 Diverticulosis of large intestine without perforation or abscess without bleeding: Secondary | ICD-10-CM | POA: Diagnosis not present

## 2018-10-07 DIAGNOSIS — Z79899 Other long term (current) drug therapy: Secondary | ICD-10-CM | POA: Diagnosis not present

## 2018-10-07 DIAGNOSIS — M109 Gout, unspecified: Secondary | ICD-10-CM | POA: Insufficient documentation

## 2018-10-07 DIAGNOSIS — K2970 Gastritis, unspecified, without bleeding: Secondary | ICD-10-CM | POA: Insufficient documentation

## 2018-10-07 DIAGNOSIS — E785 Hyperlipidemia, unspecified: Secondary | ICD-10-CM | POA: Diagnosis not present

## 2018-10-07 DIAGNOSIS — L719 Rosacea, unspecified: Secondary | ICD-10-CM | POA: Insufficient documentation

## 2018-10-07 DIAGNOSIS — I10 Essential (primary) hypertension: Secondary | ICD-10-CM | POA: Insufficient documentation

## 2018-10-07 DIAGNOSIS — L409 Psoriasis, unspecified: Secondary | ICD-10-CM | POA: Diagnosis not present

## 2018-10-07 DIAGNOSIS — Z7982 Long term (current) use of aspirin: Secondary | ICD-10-CM | POA: Insufficient documentation

## 2018-10-07 DIAGNOSIS — R338 Other retention of urine: Secondary | ICD-10-CM | POA: Diagnosis not present

## 2018-10-07 DIAGNOSIS — N401 Enlarged prostate with lower urinary tract symptoms: Secondary | ICD-10-CM | POA: Insufficient documentation

## 2018-10-07 HISTORY — PX: ESOPHAGOGASTRODUODENOSCOPY (EGD) WITH PROPOFOL: SHX5813

## 2018-10-07 HISTORY — PX: COLONOSCOPY WITH PROPOFOL: SHX5780

## 2018-10-07 SURGERY — COLONOSCOPY WITH PROPOFOL
Anesthesia: General

## 2018-10-07 MED ORDER — LIDOCAINE HCL (PF) 2 % IJ SOLN
INTRAMUSCULAR | Status: DC | PRN
  Administered 2018-10-07: 100 mg via INTRADERMAL

## 2018-10-07 MED ORDER — PROPOFOL 500 MG/50ML IV EMUL: INTRAVENOUS | Status: DC | PRN

## 2018-10-07 MED ORDER — PROPOFOL 500 MG/50ML IV EMUL
INTRAVENOUS | Status: DC | PRN
  Administered 2018-10-07: 150 ug/kg/min via INTRAVENOUS

## 2018-10-07 MED ORDER — SODIUM CHLORIDE 0.9 % IV SOLN
INTRAVENOUS | Status: DC
  Administered 2018-10-07: 1000 mL via INTRAVENOUS

## 2018-10-07 MED ORDER — PROPOFOL 10 MG/ML IV BOLUS
INTRAVENOUS | Status: DC | PRN
  Administered 2018-10-07: 60 mg via INTRAVENOUS

## 2018-10-07 NOTE — Anesthesia Post-op Follow-up Note (Signed)
Anesthesia QCDR form completed.        

## 2018-10-07 NOTE — Anesthesia Postprocedure Evaluation (Signed)
Anesthesia Post Note  Patient: Eric Dickson.  Procedure(s) Performed: COLONOSCOPY WITH PROPOFOL (N/A ) ESOPHAGOGASTRODUODENOSCOPY (EGD) WITH PROPOFOL (N/A )  Patient location during evaluation: Endoscopy Anesthesia Type: General Level of consciousness: awake and alert and oriented Pain management: pain level controlled Vital Signs Assessment: post-procedure vital signs reviewed and stable Respiratory status: spontaneous breathing, nonlabored ventilation and respiratory function stable Cardiovascular status: blood pressure returned to baseline and stable Postop Assessment: no signs of nausea or vomiting Anesthetic complications: no     Last Vitals:  Vitals:   10/07/18 1048 10/07/18 1058  BP: 126/62 136/71  Pulse: 67 65  Resp: 20 14  Temp:    SpO2: 97% 98%    Last Pain:  Vitals:   10/07/18 1058  TempSrc:   PainSc: 0-No pain                 Zana Biancardi

## 2018-10-07 NOTE — H&P (Signed)
Outpatient short stay form Pre-procedure 10/07/2018 9:54 AM Teodoro K. Alice Reichert, M.D.  Primary Physician: Johny Drilling, M.D.  Reason for visit:  Abdominal discomfort, bloating, excessive flatulence, weight loss  History of present illness:  Patient c/o several months of bloating, gas and some weight loss sometimes related to the former. Last colonoscopy in 2014 by [facility] showed two benign polyps. Otherwise denies hb, dysphagia, severe abdominal pain, melena, hematochezia or further weight loss.     Current Facility-Administered Medications:  .  0.9 %  sodium chloride infusion, , Intravenous, Continuous, [place], Benay Pike, MD, Last Rate: 20 mL/hr at 10/07/18 0828, 1,000 mL at 10/07/18 0354  Medications Prior to Admission  Medication Sig Dispense Refill Last Dose  . aspirin EC 81 MG EC tablet Take 1 tablet (81 mg total) by mouth daily. 30 tablet 2 10/07/2018 at 0600  . citalopram (CELEXA) 10 MG tablet Take by mouth.   10/07/2018 at 0600  . clindamycin (CLINDAGEL) 1 % gel Apply topically.   Past Week at Unknown time  . cyclobenzaprine (FLEXERIL) 5 MG tablet Take 1 tablet (5 mg total) by mouth 3 (three) times daily as needed for muscle spasms. 30 tablet 0 Past Week at Unknown time  . finasteride (PROSCAR) 5 MG tablet TAKE (1) TABLET BY MOUTH EVERY DAY 90 tablet 3 10/07/2018 at 0600  . ipratropium (ATROVENT) 0.06 % nasal spray Place 2 sprays into the nose 3 (three) times daily as needed for rhinitis.    10/06/2018 at Unknown time  . lisinopril (PRINIVIL,ZESTRIL) 20 MG tablet Take 20 mg by mouth 2 (two) times daily.    10/07/2018 at 0600  . omega-3 acid ethyl esters (LOVAZA) 1 g capsule Take 1 g by mouth daily.   Past Week at Unknown time  . pantoprazole (PROTONIX) 40 MG tablet Take 1 tablet (40 mg total) by mouth 2 (two) times daily before a meal. 60 tablet 0 Past Week at Unknown time  . potassium chloride SA (K-DUR,KLOR-CON) 20 MEQ tablet Take 1 tablet (20 mEq total) by mouth daily. 30 tablet 1 Past Week at  Unknown time  . sucralfate (CARAFATE) 1 g tablet Take 1 tablet (1 g total) by mouth 4 (four) times daily -  with meals and at bedtime. 120 tablet 0 Past Week at Unknown time  . tamsulosin (FLOMAX) 0.4 MG CAPS capsule Take 1 capsule (0.4 mg total) by mouth daily. 90 capsule 3 10/06/2018 at Unknown time  . amLODipine (NORVASC) 10 MG tablet Take 10 mg by mouth daily.    05/13/2018 at am  . atorvastatin (LIPITOR) 20 MG tablet Take 20 mg by mouth daily.    05/13/2018 at am     No Known Allergies   Past Medical History:  Diagnosis Date  . BPH (benign prostatic hypertrophy)   . Diastasis, muscle   . Gout   . HLD (hyperlipidemia)   . HTN (hypertension)   . Microscopic hematuria   . Obesity   . Prostatitis   . Psoriasis   . Renal mass   . Rosacea   . Shoulder pain   . Urinary retention     Review of systems:  Otherwise negative.    Physical Exam  Gen: Alert, oriented. Appears stated age.  HEENT: [place]/AT. PERRLA. Lungs: CTA, no wheezes. CV: RR nl S1, S2. Abd: soft, benign, no masses. BS+ Ext: No edema. Pulses 2+    Planned procedures: Proceed with EGD and  colonoscopy. The patient understands the nature of the planned procedure, indications, risks, alternatives  and potential complications including but not limited to bleeding, infection, perforation, damage to internal organs and possible oversedation/side effects from anesthesia. The patient agrees and gives consent to proceed.  Please refer to procedure notes for findings, recommendations and patient disposition/instructions.     Teodoro K. Alice Reichert, M.D. Gastroenterology 10/07/2018  9:54 AM

## 2018-10-07 NOTE — Transfer of Care (Signed)
Immediate Anesthesia Transfer of Care Note  Patient: Eric Dickson.  Procedure(s) Performed: COLONOSCOPY WITH PROPOFOL (N/A ) ESOPHAGOGASTRODUODENOSCOPY (EGD) WITH PROPOFOL (N/A )  Patient Location: PACU  Anesthesia Type:General  Level of Consciousness: sedated  Airway & Oxygen Therapy: Patient Spontanous Breathing and Patient connected to nasal cannula oxygen  Post-op Assessment: Report given to RN and Post -op Vital signs reviewed and stable  Post vital signs: Reviewed and stable  Last Vitals:  Vitals Value Taken Time  BP 96/51 10/07/2018 10:28 AM  Temp 36.2 C 10/07/2018 10:28 AM  Pulse 60 10/07/2018 10:32 AM  Resp 16 10/07/2018 10:32 AM  SpO2 96 % 10/07/2018 10:32 AM  Vitals shown include unvalidated device data.  Last Pain:  Vitals:   10/07/18 1028  TempSrc: Tympanic  PainSc: Asleep         Complications: No apparent anesthesia complications

## 2018-10-07 NOTE — Interval H&P Note (Signed)
History and Physical Interval Note:  10/07/2018 9:56 AM  Eric Dickson.  has presented today for surgery, with the diagnosis of WT LOSS ABD DISCOMFORT ABNORMAL ABD CT SCAN  The various methods of treatment have been discussed with the patient and family. After consideration of risks, benefits and other options for treatment, the patient has consented to  Procedure(s): COLONOSCOPY WITH PROPOFOL (N/A) ESOPHAGOGASTRODUODENOSCOPY (EGD) WITH PROPOFOL (N/A) as a surgical intervention .  The patient's history has been reviewed, patient examined, no change in status, stable for surgery.  I have reviewed the patient's chart and labs.  Questions were answered to the patient's satisfaction.     [place], [place]

## 2018-10-07 NOTE — Anesthesia Preprocedure Evaluation (Signed)
Anesthesia Evaluation  Patient identified by MRN, date of birth, ID band Patient awake    Reviewed: Allergy & Precautions, NPO status , Patient's Chart, lab work & pertinent test results  History of Anesthesia Complications Negative for: history of anesthetic complications  Airway Mallampati: III  TM Distance: >3 FB Neck ROM: Full    Dental no notable dental hx.    Pulmonary neg pulmonary ROS, neg sleep apnea, neg COPD,    breath sounds clear to auscultation- rhonchi (-) wheezing      Cardiovascular hypertension, Pt. on medications (-) CAD, (-) Past MI, (-) Cardiac Stents and (-) CABG  Rhythm:Regular Rate:Normal - Systolic murmurs and - Diastolic murmurs    Neuro/Psych neg Seizures negative neurological ROS  negative psych ROS   GI/Hepatic negative GI ROS, Neg liver ROS,   Endo/Other  neg diabetes  Renal/GU negative Renal ROS     Musculoskeletal negative musculoskeletal ROS (+)   Abdominal (+) + obese,   Peds  Hematology negative hematology ROS (+)   Anesthesia Other Findings Past Medical History: No date: BPH (benign prostatic hypertrophy) No date: Diastasis, muscle No date: Gout No date: HLD (hyperlipidemia) No date: HTN (hypertension) No date: Microscopic hematuria No date: Obesity No date: Prostatitis No date: Psoriasis No date: Renal mass No date: Rosacea No date: Shoulder pain No date: Urinary retention   Reproductive/Obstetrics                             Anesthesia Physical Anesthesia Plan  ASA: II  Anesthesia Plan: General   Post-op Pain Management:    Induction: Intravenous  PONV Risk Score and Plan: 1 and Propofol infusion  Airway Management Planned: Natural Airway  Additional Equipment:   Intra-op Plan:   Post-operative Plan:   Informed Consent: I have reviewed the patients History and Physical, chart, labs and discussed the procedure including the  risks, benefits and alternatives for the proposed anesthesia with the patient or authorized representative who has indicated his/her understanding and acceptance.     Dental advisory given  Plan Discussed with: CRNA and Anesthesiologist  Anesthesia Plan Comments:         Anesthesia Quick Evaluation

## 2018-10-08 ENCOUNTER — Encounter: Payer: Self-pay | Admitting: Internal Medicine

## 2018-10-08 LAB — SURGICAL PATHOLOGY

## 2018-10-08 NOTE — Op Note (Signed)
[facility]enter[facility] Patient Name: Eric Dickson Procedure Date: 10/07/2018 9:55 AM MRN: [id number] Account #: [id number] Date of Birth: [birth year] Admit Type: Outpatient Age: 73 Room: [facility] ENDO ROOM 2 Gender: Male Note Status: Finalized Procedure:            Upper GI endoscopy Indications:          Generalized abdominal pain, Abdominal bloating Providers:            Benay Pike. Alice Reichert MD, MD Referring MD:         Wynona Canes. Kym Groom, MD (Referring MD) Medicines:            Propofol per Anesthesia Complications:        No immediate complications. Procedure:            Pre-Anesthesia Assessment:                       - The risks and benefits of the procedure and the                        sedation options and risks were discussed with the                        patient. All questions were answered and informed                        consent was obtained.                       - Patient identification and proposed procedure were                        verified prior to the procedure by the nurse. The                        procedure was verified in the procedure room.                       - ASA Grade Assessment: III - A patient with severe                        systemic disease.                       - After reviewing the risks and benefits, the patient                        was deemed in satisfactory condition to undergo the                        procedure.                       After obtaining informed consent, the endoscope was                        passed under direct vision. Throughout the procedure,                        the patient's blood pressure, pulse, and oxygen  saturations were monitored continuously. The Endoscope                        was introduced through the mouth, and advanced to the                        third part of duodenum. The upper GI endoscopy was                        accomplished without  difficulty. The patient tolerated                        the procedure well. Findings:      Non-occlusive lower esophageal "B" ring      A 1 cm hiatal hernia was present.      Localized mild inflammation characterized by erythema was found in the       gastric antrum. Biopsies were taken with a cold forceps for Helicobacter       pylori testing.      The examined duodenum was normal.      The exam was otherwise without abnormality. Impression:           - 1 cm hiatal hernia.                       - Gastritis. Biopsied.                       - Normal examined duodenum.                       - The examination was otherwise normal. Recommendation:       - Await pathology results.                       - Proceed with colonoscopy Procedure Code(s):    --- Professional ---                       [phone], Esophagogastroduodenoscopy, flexible, transoral;                        with biopsy, single or multiple Diagnosis Code(s):    --- Professional ---                       R14.0, Abdominal distension (gaseous)                       R10.84, Generalized abdominal pain                       K29.70, Gastritis, unspecified, without bleeding                       K44.9, Diaphragmatic hernia without obstruction or                        gangrene CPT copyright 2018 American Medical [employer]. All rights reserved. The codes documented in this report are preliminary and upon coder review may  be revised to meet current compliance requirements. Efrain Sella MD, MD 10/07/2018 10:10:27 AM This report has been signed electronically. Number of Addenda: 0 Note Initiated On: 10/07/2018 9:55 AM  [facility]

## 2018-10-08 NOTE — Op Note (Signed)
[facility] Patient Name: Eric Dickson Procedure Date: 10/07/2018 9:52 AM MRN: [id number] Account #: [id number] Date of Birth: 1946/07/07 Admit Type: Outpatient Age: 73 Room: [facility] ENDO ROOM 2 Gender: Male Note Status: Finalized Procedure:            Colonoscopy Indications:          Generalized abdominal pain Providers:            Benay Pike. Alice Reichert MD, MD Referring MD:         Wynona Canes. Kym Groom, MD (Referring MD) Medicines:            Propofol per Anesthesia Complications:        No immediate complications. Procedure:            Pre-Anesthesia Assessment:                       - The risks and benefits of the procedure and the                        sedation options and risks were discussed with the                        patient. All questions were answered and informed                        consent was obtained.                       - Patient identification and proposed procedure were                        verified prior to the procedure by the nurse. The                        procedure was verified in the procedure room.                       - ASA Grade Assessment: III - A patient with severe                        systemic disease.                       - After reviewing the risks and benefits, the patient                        was deemed in satisfactory condition to undergo the                        procedure.                       After obtaining informed consent, the colonoscope was                        passed under direct vision. Throughout the procedure,                        the patient's blood pressure, pulse, and oxygen  saturations were monitored continuously. The                        Colonoscope was introduced through the anus and                        advanced to the the cecum, identified by appendiceal                        orifice and ileocecal valve. The colonoscopy was     performed without difficulty. The patient tolerated the                        procedure well. The quality of the bowel preparation                        was adequate. The ileocecal valve, appendiceal orifice,                        and rectum were photographed. Findings:      The perianal and digital rectal examinations were normal. Pertinent       negatives include normal sphincter tone and no palpable rectal lesions.      Many small-mouthed diverticula were found in the sigmoid colon.      A 4 mm polyp was found in the proximal sigmoid colon. The polyp was       sessile. The polyp was removed with a jumbo cold forceps. Resection and       retrieval were complete.      Non-bleeding internal hemorrhoids were found during retroflexion. The       hemorrhoids were Grade II (internal hemorrhoids that prolapse but reduce       spontaneously).      The exam was otherwise without abnormality.      The terminal ileum appeared normal. Impression:           - Diverticulosis in the sigmoid colon.                       - One 4 mm polyp in the proximal sigmoid colon, removed                        with a jumbo cold forceps. Resected and retrieved.                       - Non-bleeding internal hemorrhoids.                       - The examination was otherwise normal. Recommendation:       - Patient has a contact number available for                        emergencies. The signs and symptoms of potential                        delayed complications were discussed with the patient.                        Return to normal activities tomorrow. Written discharge  instructions were provided to the patient.                       - Resume previous diet.                       - Continue present medications.                       - Repeat colonoscopy in 5 years for surveillance.                       - Return to physician assistant in 3 months.                       - The findings and  recommendations were discussed with                        the patient and their spouse. Procedure Code(s):    --- Professional ---                       [phone], Colonoscopy, flexible; with biopsy, single or                        multiple Diagnosis Code(s):    --- Professional ---                       K57.30, Diverticulosis of large intestine without                        perforation or abscess without bleeding                       R10.84, Generalized abdominal pain                       D12.5, Benign neoplasm of sigmoid colon                       K64.1, Second degree hemorrhoids CPT copyright 2018 American Medical Association. All rights reserved. The codes documented in this report are preliminary and upon coder review may  be revised to meet current compliance requirements. Efrain Sella MD, MD 10/07/2018 10:27:35 AM This report has been signed electronically. Number of Addenda: 0 Note Initiated On: 10/07/2018 9:52 AM Scope Withdrawal Time: 0 hours 8 minutes 45 seconds  Total Procedure Duration: 0 hours 10 minutes 32 seconds       [facility]

## 2019-08-03 ENCOUNTER — Ambulatory Visit: Payer: Medicare Other | Admitting: Urology

## 2019-08-03 ENCOUNTER — Ambulatory Visit: Payer: Self-pay | Admitting: Urology

## 2019-09-28 ENCOUNTER — Other Ambulatory Visit: Payer: Self-pay | Admitting: Urology

## 2019-09-28 DIAGNOSIS — N401 Enlarged prostate with lower urinary tract symptoms: Secondary | ICD-10-CM

## 2019-10-12 ENCOUNTER — Encounter: Payer: Self-pay | Admitting: Urology

## 2019-10-12 ENCOUNTER — Ambulatory Visit (INDEPENDENT_AMBULATORY_CARE_PROVIDER_SITE_OTHER): Payer: Medicare Other | Admitting: Urology

## 2019-10-12 ENCOUNTER — Other Ambulatory Visit: Payer: Self-pay

## 2019-10-12 VITALS — BP 200/84 | HR 75 | Ht 69.0 in | Wt 228.0 lb

## 2019-10-12 DIAGNOSIS — N401 Enlarged prostate with lower urinary tract symptoms: Secondary | ICD-10-CM

## 2019-10-12 DIAGNOSIS — N138 Other obstructive and reflux uropathy: Secondary | ICD-10-CM

## 2019-10-12 LAB — BLADDER SCAN AMB NON-IMAGING

## 2019-10-12 MED ORDER — FINASTERIDE 5 MG PO TABS: 5.0000 mg | ORAL_TABLET | Freq: Every day | ORAL | 3 refills | Status: DC

## 2019-10-12 MED ORDER — TAMSULOSIN HCL 0.4 MG PO CAPS: 0.4000 mg | ORAL_CAPSULE | Freq: Every day | ORAL | 3 refills | Status: DC

## 2019-10-12 NOTE — Progress Notes (Signed)
10/12/2019 2:26 PM   Eric Jeanella Craze Jr. 1946/05/01 [id number]  Referring provider: [name], [place],   [id number]  Chief Complaint  Patient presents with  . Benign Prostatic Hypertrophy    1year follow up    HPI: 74 yo WM with BPH with LU TS who presents for follow up.    BPH WITH LUTS His IPSS score today is 5, which is mild lower urinary tract symptomatology. He is delighted with his quality life due to his urinary symptoms. His PVR is 71 mL.  His previous I PSS 3/2.  His previous PVR was 58 mL.  He has no major complaints at this time.  He currently taking tamsulosin 0.4 mg daily and finasteride 5 mg daily.    Patient denies any gross hematuria, dysuria or suprapubic/flank pain.  Patient denies any fevers, chills, nausea or vomiting.  IPSS    Row Name 10/12/19 1300         International Prostate Symptom Score   How often have you had the sensation of not emptying your bladder?  Less than 1 in 5     How often have you had to urinate less than every two hours?  Less than 1 in 5 times     How often have you found you stopped and started again several times when you urinated?  Not at All     How often have you found it difficult to postpone urination?  Not at All     How often have you had a weak urinary stream?  Less than 1 in 5 times     How often have you had to strain to start urination?  Less than 1 in 5 times     How many times did you typically get up at night to urinate?  1 Time     Total IPSS Score  5       Quality of Life due to urinary symptoms   If you were to spend the rest of your life with your urinary condition just the way it is now how would you feel about that?  Delighted        Score:  1-7 Mild 8-19 Moderate 20-35 Severe   PMH: Past Medical History:  Diagnosis Date  . BPH (benign prostatic hypertrophy)   . Diastasis, muscle   . Gout   . HLD (hyperlipidemia)   . HTN (hypertension)   . Microscopic  hematuria   . Obesity   . Prostatitis   . Psoriasis   . Renal mass    Dromedary hump  . Rosacea   . Shoulder pain   . Urinary retention     Surgical History: Past Surgical History:  Procedure Laterality Date  . COLONOSCOPY WITH PROPOFOL    . COLONOSCOPY WITH PROPOFOL N/A 10/07/2018   Procedure: COLONOSCOPY WITH PROPOFOL;  Surgeon: Toledo, Benay Pike, MD;  Location: [facility] ENDOSCOPY;  Service: Gastroenterology;  Laterality: N/A;  . ESOPHAGOGASTRODUODENOSCOPY (EGD) WITH PROPOFOL N/A 10/07/2018   Procedure: ESOPHAGOGASTRODUODENOSCOPY (EGD) WITH PROPOFOL;  Surgeon: Toledo, Benay Pike, MD;  Location: [facility] ENDOSCOPY;  Service: Gastroenterology;  Laterality: N/A;  . hydrostent removal      Home Medications:  Allergies as of 10/12/2019   No Known Allergies     Medication List       Accurate as of October 12, 2019  2:26 PM. If you have any questions, ask your nurse or doctor.  amLODipine 10 MG tablet Commonly known as: NORVASC Take 10 mg by mouth daily.   aspirin 81 MG EC tablet Take 1 tablet (81 mg total) by mouth daily.   atorvastatin 20 MG tablet Commonly known as: LIPITOR Take 20 mg by mouth daily.   citalopram 10 MG tablet Commonly known as: CELEXA Take by mouth.   cyclobenzaprine 5 MG tablet Commonly known as: FLEXERIL Take 1 tablet (5 mg total) by mouth 3 (three) times daily as needed for muscle spasms.   finasteride 5 MG tablet Commonly known as: PROSCAR Take 1 tablet (5 mg total) by mouth daily. What changed: See the new instructions. Changed by: Larene Beach Cristofer Yaffe, PA-C   ipratropium 0.06 % nasal spray Commonly known as: ATROVENT Place 2 sprays into the nose 3 (three) times daily as needed for rhinitis.   lisinopril 20 MG tablet Commonly known as: ZESTRIL Take 20 mg by mouth 2 (two) times daily.   omega-3 acid ethyl esters 1 g capsule Commonly known as: LOVAZA Take 1 g by mouth daily.   pantoprazole 40 MG tablet Commonly known as: Protonix Take 1 tablet  (40 mg total) by mouth 2 (two) times daily before a meal.   potassium chloride SA 20 MEQ tablet Commonly known as: KLOR-CON Take 1 tablet (20 mEq total) by mouth daily.   sucralfate 1 g tablet Commonly known as: CARAFATE Take 1 tablet (1 g total) by mouth 4 (four) times daily -  with meals and at bedtime.   tamsulosin 0.4 MG Caps capsule Commonly known as: FLOMAX Take 1 capsule (0.4 mg total) by mouth daily.       Allergies: No Known Allergies  Family History: Family History  Problem Relation Age of Onset  . COPD Mother   . Heart disease Father   . Kidney disease Neg Hx   . Prostate cancer Neg Hx   . Kidney cancer Neg Hx   . Bladder Cancer Neg Hx     Social History:  reports that he has never smoked. He quit smokeless tobacco use about 28 years ago.  His smokeless tobacco use included chew. He reports that he does not drink alcohol or use drugs.  ROS: For pertinent review of systems please refer to history of present illness  Physical Exam: BP (!) 200/84   Pulse 75   Ht 5\' 9"  (1.753 m)   Wt 228 lb (103.4 kg)   SpO2 96%   BMI 33.67 kg/m   Constitutional:  Well nourished. Alert and oriented, No acute distress. HEENT: [place] AT, mask in place.  Trachea midline, no masses. Cardiovascular: No clubbing, cyanosis, or edema. Respiratory: Normal respiratory effort, no increased work of breathing. GI: Abdomen is soft, non tender, non distended, no abdominal masses.  GU: No CVA tenderness.  No bladder fullness or masses.  Patient with circumcised phallus. Urethral meatus is patent.  No penile discharge. No penile lesions or rashes. Scrotum without lesions, cysts, rashes and/or edema.  Testicles are located scrotally bilaterally. No masses are appreciated in the testicles. Left and right epididymis are normal. Rectal: Patient with  normal sphincter tone. Anus and perineum without scarring or rashes. No rectal masses are appreciated. Prostate is approximately 50 grams, could only  palpate the apex and the midportion of the gland, no nodules are appreciated.   Could not palpate the seminal vesicles.   Skin: No rashes, bruises or suspicious lesions. Lymph: No inguinal adenopathy. Neurologic: Grossly intact, no focal deficits, moving all 4 extremities. Psychiatric: Normal mood and  affect.   Laboratory Data: PSA History    1.5 ng/mL on 07/04/2015  1.0 ng/mL on 12/27/2015  0.79 ng/mL on 01/02/2017  0.82 in 08/2018 I have reviewed the labs.   Assessment & Plan:    1. BPH with LUTS IPSS score is 5/0, it is slightly worse Continue conservative management, avoiding bladder irritants and timed voiding's Continue tamsulosin 0.4 mg daily and finasteride 5 mg daily; refills given  RTC in 12 months for I PSS and exam   2. HTN PCP contacted and appointment made for follow up    Return in about 1 year (around 10/11/2020) for IPSS and exam.  Zara Council, [facility]  [place] [address] [place], [place] [address] [phone]

## 2020-07-12 IMAGING — RF DG ESOPHAGUS
11 series · 14 of 14 positions shown · non-contrast
Comparison: Chest x-ray of April 29, 2018

CLINICAL DATA: Gas and belching for the past 3 weeks. History of
gastroesophageal reflux

EXAM:
ESOPHOGRAM / BARIUM SWALLOW / BARIUM TABLET STUDY
TECHNIQUE: Combined double contrast and single contrast examination performed
using effervescent crystals, thick barium liquid, and thin barium
liquid. The patient was observed with fluoroscopy swallowing a 13 mm
barium sulphate tablet.
FLUOROSCOPY TIME:  Fluoroscopy Time:  0 minutes, 54 seconds
Radiation Exposure Index (if provided by the fluoroscopic device):
36.4 mGy
Number of Acquired Spot Images: 9+ 1 screen save and 1 video loop.

[Series 1: fluoro_barium 2fps_bw · 0.17mm/px · 1 of 1 slices shown (1 of 10)]
[im 1/1]
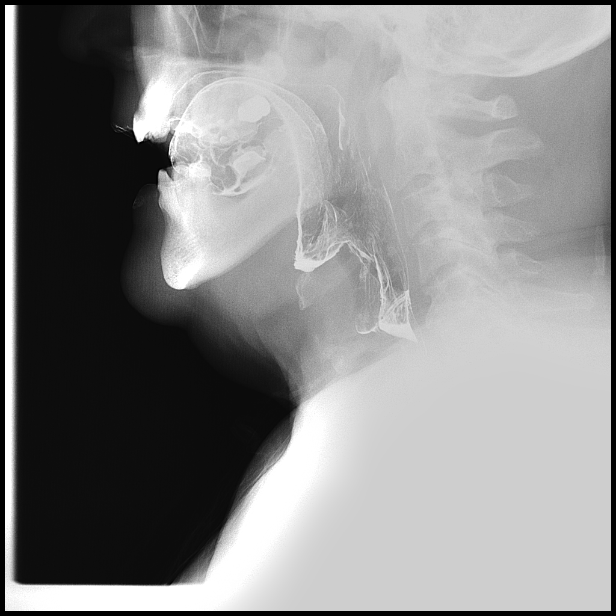

[Series 2: fluoro_barium 2fps_bw · 0.17mm/px · 1 of 1 slices shown (2 of 10)]
[im 1/1]
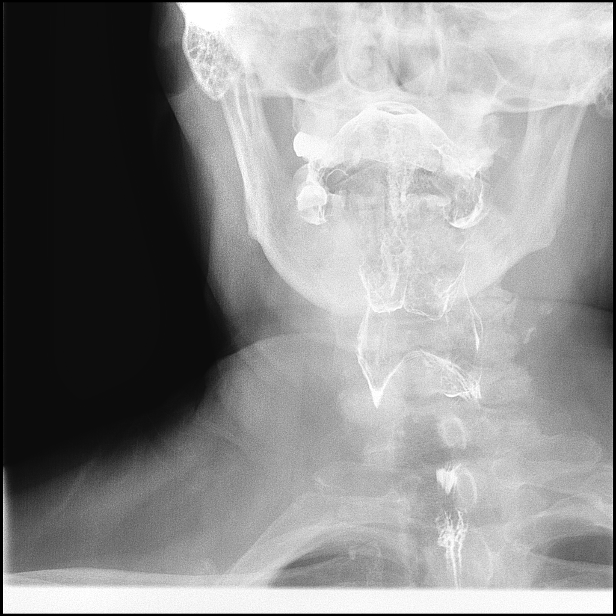

[Series 3: fluoro_barium 2fps_bw · 0.17mm/px · 1 of 1 slices shown (3 of 10)]
[im 1/1]
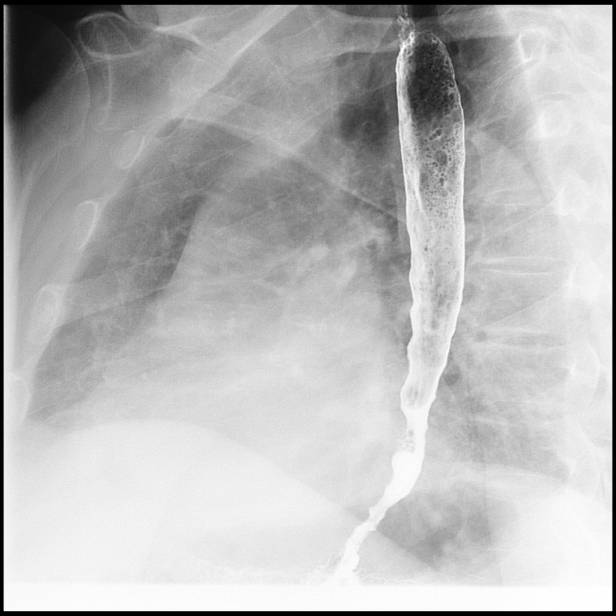

[Series 4: fluoro_barium 2fps_bw · 0.17mm/px · 1 of 1 slices shown (4 of 10)]
[im 1/1]
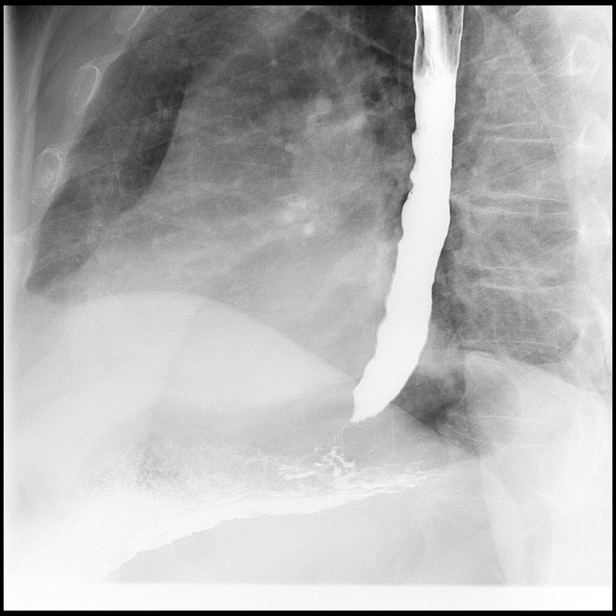

[Series 5: fluoro_barium 2fps_bw · 0.17mm/px · 1 of 1 slices shown (5 of 10)]
[im 1/1]
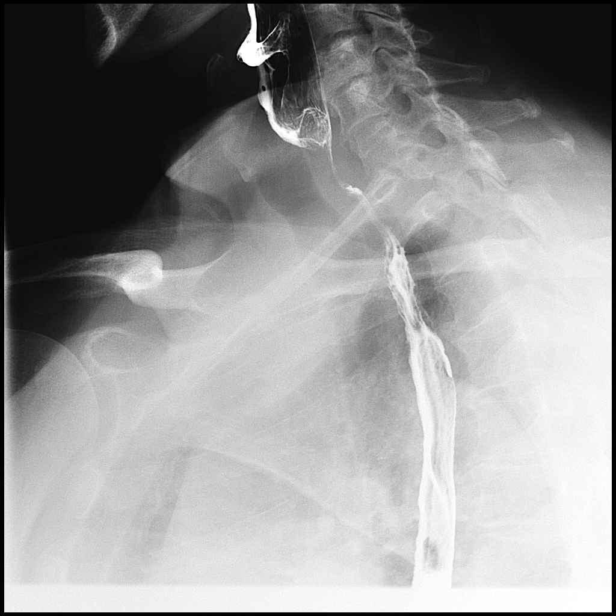

[Series 6: fluoro_barium 2fps_bw · 0.19mm/px · 1 of 1 slices shown (6 of 10)]
[im 1/1]
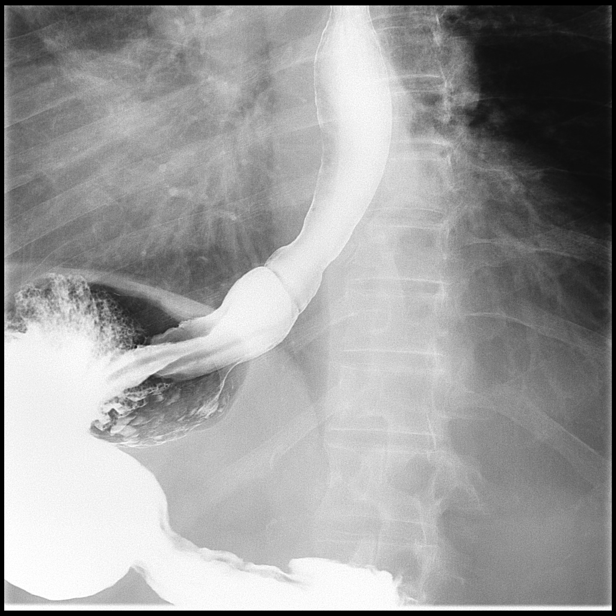

[Series 7: fluoro_barium 2fps_bw · 0.19mm/px · 1 of 1 slices shown (7 of 10)]
[im 1/1]
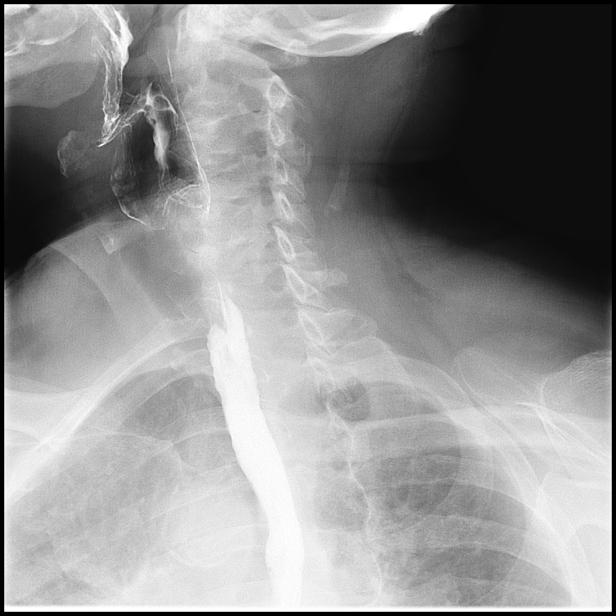

[Series 8: fluoro_barium 2fps_bw · 0.19mm/px · 1 of 1 slices shown (8 of 10)]
[im 1/1]
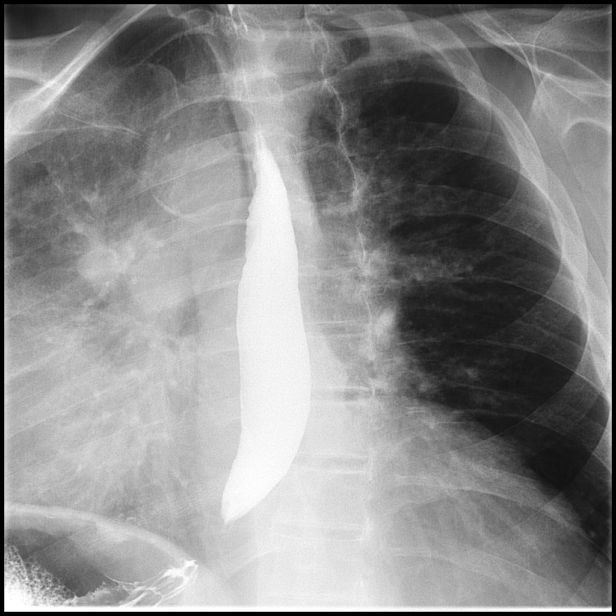

[Series 9: fluoro_barium 2fps_bw · 0.19mm/px · 4 of 17 frames shown (9 of 10)]
[frame 3/17]
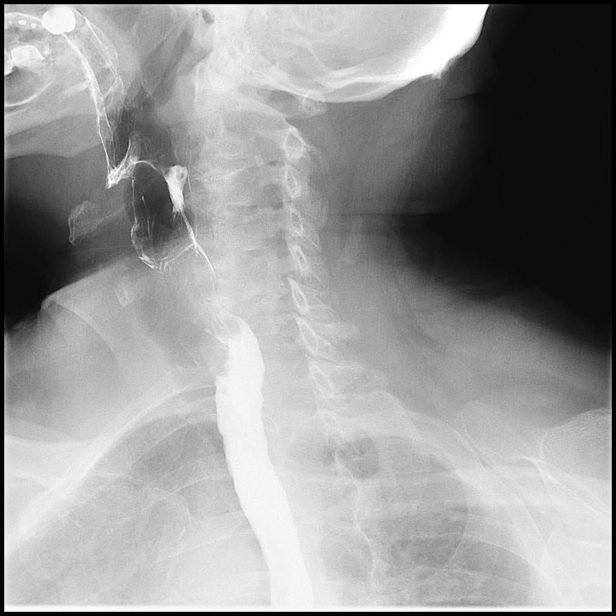
[frame 9/17]
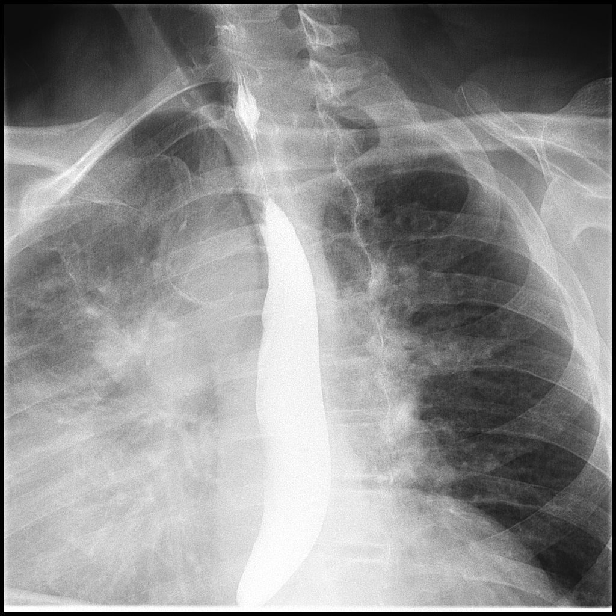
[frame 13/17]
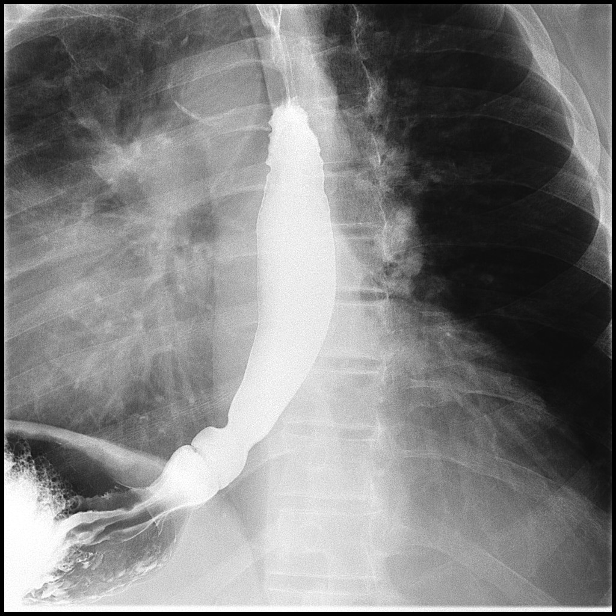
[frame 15/17]
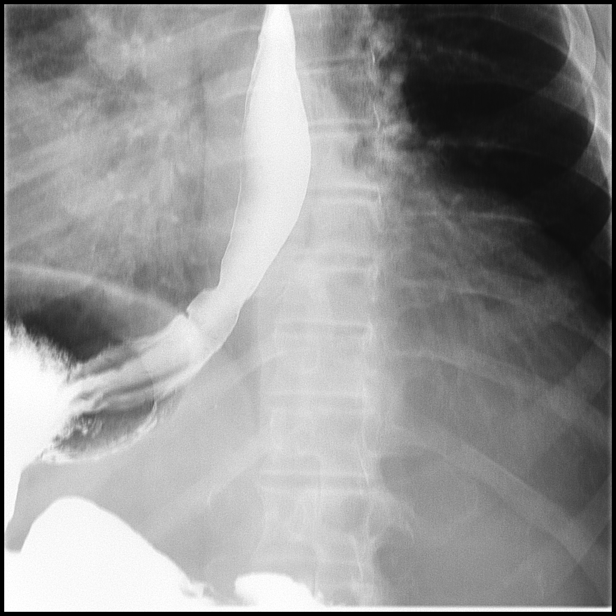

[Series 10: fluoro_barium 2fps_bw · 0.20mm/px · 1 of 1 slices shown (10 of 10)]
[im 1/1]
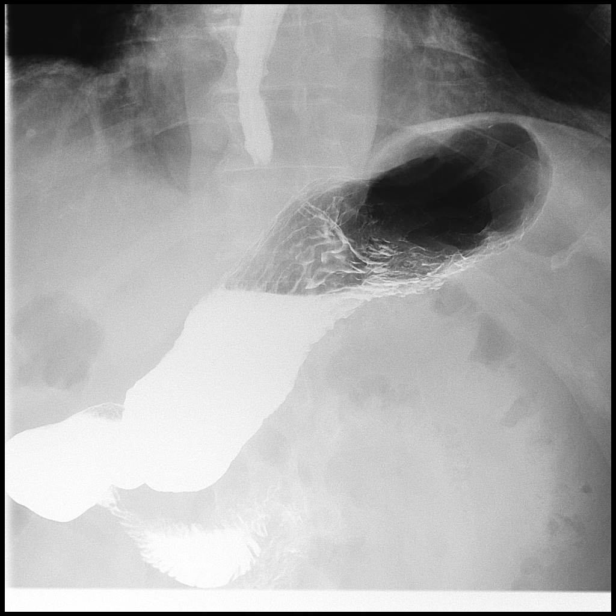

[Series 11: cp_standard · 0.20mm/px · 1 of 1 slices shown]
[im 1/1]
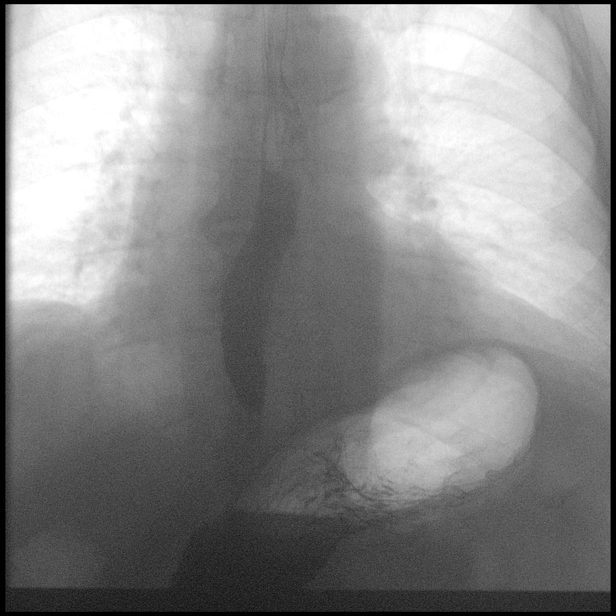

[14 of 14 positions shown; findings below may reference images not displayed]

FINDINGS: The patient ingested thick and thin barium and the gas-forming
crystals without difficulty. The hypopharynx distended well. There
was no laryngeal penetration of the barium. The cervical esophagus
distended well. The thoracic esophagus also distended well down to
the level of the GE junction. A small incompletely reducible hiatal
hernia was observed. There was a small amount of gastroesophageal
reflux. There was no evidence of a stricture nor of esophagitis. A
few tertiary contractions were observed intermittently. The barium
tablet passed promptly from the mouth to the stomach.
IMPRESSION: Small reducible hiatal hernia without evidence of stricture or
esophagitis. Small amount of spontaneous gastroesophageal reflux.
Mild changes of presbyesophagus.

## 2020-08-09 ENCOUNTER — Other Ambulatory Visit: Payer: Self-pay | Admitting: Urology

## 2020-08-09 DIAGNOSIS — N401 Enlarged prostate with lower urinary tract symptoms: Secondary | ICD-10-CM

## 2020-10-08 NOTE — Progress Notes (Signed)
10/10/2020 2:28 PM   Eric Jeanella Craze Jr. 06/08/1946 [id number]  Referring provider: [name], MD [address] [place],  [place] [address]  Chief Complaint  Patient presents with   Follow-up    1 year follow-up    HPI: 75 yo M with BPH with LU TS who presents for follow up.    BPH WITH LUTS His IPSS score today is 4, which is mild lower urinary tract symptomatology. He is delighted with his quality life due to his urinary symptoms.  His previous I PSS 5/0.  His previous PVR was 71 mL.    He has no major complaints at this time.  He currently taking tamsulosin 0.4 mg daily and finasteride 5 mg daily.      Patient denies any gross hematuria, dysuria or suprapubic/flank pain.  Patient denies any fevers, chills, nausea or vomiting.    IPSS    Row Name 10/10/20 1400         International Prostate Symptom Score   How often have you had the sensation of not emptying your bladder? Not at All     How often have you had to urinate less than every two hours? Less than 1 in 5 times     How often have you found you stopped and started again several times when you urinated? Not at All     How often have you found it difficult to postpone urination? Not at All     How often have you had a weak urinary stream? Less than 1 in 5 times     How often have you had to strain to start urination? Less than 1 in 5 times     How many times did you typically get up at night to urinate? 1 Time     Total IPSS Score 4           Quality of Life due to urinary symptoms   If you were to spend the rest of your life with your urinary condition just the way it is now how would you feel about that? Delighted            Score:  1-7 Mild 8-19 Moderate 20-35 Severe   PMH: Past Medical History:  Diagnosis Date   BPH (benign prostatic hypertrophy)    Diastasis, muscle    Gout    HLD (hyperlipidemia)    HTN (hypertension)    Microscopic hematuria    Obesity     Prostatitis    Psoriasis    Renal mass    Dromedary hump   Rosacea    Shoulder pain    Urinary retention     Surgical History: Past Surgical History:  Procedure Laterality Date   COLONOSCOPY WITH PROPOFOL     COLONOSCOPY WITH PROPOFOL N/A 10/07/2018   Procedure: COLONOSCOPY WITH PROPOFOL;  Surgeon: Toledo, Benay Pike, MD;  Location: [facility] ENDOSCOPY;  Service: Gastroenterology;  Laterality: N/A;   ESOPHAGOGASTRODUODENOSCOPY (EGD) WITH PROPOFOL N/A 10/07/2018   Procedure: ESOPHAGOGASTRODUODENOSCOPY (EGD) WITH PROPOFOL;  Surgeon: Toledo, Benay Pike, MD;  Location: [facility] ENDOSCOPY;  Service: Gastroenterology;  Laterality: N/A;   hydrostent removal      Home Medications:  Allergies as of 10/10/2020   No Known Allergies     Medication List       Accurate as of October 10, 2020  2:28 PM. If you have any questions, ask your nurse or doctor.        STOP taking these medications  cyclobenzaprine 5 MG tablet Commonly known as: FLEXERIL Stopped by: Chaney Maclaren, PA-C   ipratropium 0.06 % nasal spray Commonly known as: ATROVENT Stopped by: Rosalyn Archambault, PA-C   pantoprazole 40 MG tablet Commonly known as: Protonix Stopped by: Melvena Vink, PA-C   potassium chloride SA 20 MEQ tablet Commonly known as: KLOR-CON Stopped by: Emalynn Clewis, PA-C   sucralfate 1 g tablet Commonly known as: CARAFATE Stopped by: Annahi Short, PA-C     TAKE these medications   amLODipine 5 MG tablet Commonly known as: NORVASC Take 1 tablet by mouth 2 (two) times daily. What changed: Another medication with the same name was removed. Continue taking this medication, and follow the directions you see here. Changed by: Zara Council, PA-C   aspirin 81 MG EC tablet Take 1 tablet (81 mg total) by mouth daily.   atorvastatin 20 MG tablet Commonly known as: LIPITOR Take 20 mg by mouth daily.   carvedilol 12.5 MG tablet Commonly known as: COREG Take 12.5 mg by mouth 2 (two) times  daily.   chlorthalidone 25 MG tablet Commonly known as: HYGROTON Take 1 tablet by mouth daily.   citalopram 10 MG tablet Commonly known as: CELEXA Take 1 tablet by mouth daily. What changed: Another medication with the same name was removed. Continue taking this medication, and follow the directions you see here. Changed by: Zara Council, PA-C   finasteride 5 MG tablet Commonly known as: PROSCAR TAKE (1) TABLET BY MOUTH EVERY DAY   lisinopril 30 MG tablet Commonly known as: ZESTRIL Take 1 tablet by mouth 2 (two) times daily. What changed: Another medication with the same name was removed. Continue taking this medication, and follow the directions you see here. Changed by: Zara Council, PA-C   omega-3 acid ethyl esters 1 g capsule Commonly known as: LOVAZA Take 1 g by mouth daily.   tamsulosin 0.4 MG Caps capsule Commonly known as: FLOMAX Take 1 capsule (0.4 mg total) by mouth daily.       Allergies: No Known Allergies  Family History: Family History  Problem Relation Age of Onset   COPD Mother    Heart disease Father    Kidney disease Neg Hx    Prostate cancer Neg Hx    Kidney cancer Neg Hx    Bladder Cancer Neg Hx     Social History:  reports that he has never smoked. He quit smokeless tobacco use about 29 years ago.  His smokeless tobacco use included chew. He reports that he does not drink alcohol and does not use drugs.  ROS: For pertinent review of systems please refer to history of present illness  Physical Exam: BP (!) 187/82    Pulse 68    Ht '5\' 9"'  (1.753 m)    Wt 224 lb (101.6 kg)    BMI 33.08 kg/m   Constitutional:  Well nourished. Alert and oriented, No acute distress. HEENT: [place] AT, mask in place.  Trachea midline Cardiovascular: No clubbing, cyanosis, or edema. Respiratory: Normal respiratory effort, no increased work of breathing. GU: No CVA tenderness.  No bladder fullness or masses.  Patient with circumcised phallus.  Urethral  meatus is patent.  No penile discharge. No penile lesions or rashes. Scrotum without lesions, cysts, rashes and/or edema.  Testicles are located scrotally bilaterally. No masses are appreciated in the testicles. Left and right epididymis are normal. Rectal: Patient with  normal sphincter tone. Anus and perineum without scarring or rashes. No rectal masses are appreciated. Prostate is  approximately 50 grams, could only palpate the apex and midportion of the gland, no nodules are appreciated. Seminal vesicles could not be palpated. Lymph: No inguinal adenopathy. Neurologic: Grossly intact, no focal deficits, moving all 4 extremities. Psychiatric: Normal mood and affect.   Laboratory Data: PSA History    1.5 ng/mL on 07/04/2015  1.0 ng/mL on 12/27/2015  0.79 ng/mL on 01/02/2017  0.82 in 08/2018  Specimen:  Blood  Ref Range & Units 1 mo ago Comments  Sodium 135 - 145 mmol/L 139    Potassium 3.5 - 5.0 mmol/L 4.4    Chloride 98 - 108 mmol/L 104    Carbon Dioxide (CO2) 21 - 30 mmol/L 25    Urea Nitrogen (BUN) 7 - 20 mg/dL 15    Creatinine 0.6 - 1.3 mg/dL 1.0    Glucose 70 - 140 mg/dL 104  Interpretive Data:  Above is the NONFASTING reference range.   Below are the FASTING reference ranges:  NORMAL:   70-99 mg/dL  PREDIABETES: 100-125 mg/dL  DIABETES:  > 125 mg/dL   Calcium 8.7 - 10.2 mg/dL 9.0    Anion Gap 3 - 12 mmol/L 10    BUN/CREA Ratio 6 - 27 15    Glomerular Filtration Rate (eGFR)  mL/min/1.73sq m 73   NON-Modified eGFR: 73 mL/min/1.73sq m .  We recommend using this value for referral decisions.   Modified eGFR : 85 mL/min/1.73sq m .  This eGFR estimates kidney function using the CKD-Epi equation that increases eGFR by 16% for patients identified as Black/African-American in the medical record. This value was previously reported as the single eGFR before 01/19/2020.   Interpretive Ranges for eGFR(CKD-EPI):   eGFR:       > 60 mL/min/1.73 sq m - Normal  eGFR:        30 - 59 mL/min/1.73 sq m - Moderately Decreased  eGFR:       15 - 29 mL/min/1.73 sq m - Severely Decreased  eGFR:       < 15 mL/min/1.73 sq m - Kidney Failure   Note: These eGFR calculations do not apply in acute situations  when eGFR is changing rapidly or in patients on dialysis.   Resulting Agency  [facility]   Specimen Collected: 09/01/20 8:37 AM Last Resulted: 09/01/20 4:20 PM  Received From: [facility]  Result Received: 10/08/20 4:35 PM    I have reviewed the labs.   Assessment & Plan:    1. BPH with LUTS - IPSS score is stable  - Continue conservative management, avoiding bladder irritants and timed voiding's - Continue tamsulosin 0.4 mg daily and finasteride 5 mg daily; refills given  Return in about 1 year (around 10/10/2021) for I PSS and exam .  Zara Council, [facility]  [place], [place] [address] [phone]

## 2020-10-10 ENCOUNTER — Ambulatory Visit (INDEPENDENT_AMBULATORY_CARE_PROVIDER_SITE_OTHER): Payer: Medicare Other | Admitting: Urology

## 2020-10-10 ENCOUNTER — Encounter: Payer: Self-pay | Admitting: Urology

## 2020-10-10 ENCOUNTER — Other Ambulatory Visit: Payer: Self-pay

## 2020-10-10 VITALS — BP 187/82 | HR 68 | Ht 69.0 in | Wt 224.0 lb

## 2020-10-10 DIAGNOSIS — N138 Other obstructive and reflux uropathy: Secondary | ICD-10-CM | POA: Diagnosis not present

## 2020-10-10 DIAGNOSIS — N401 Enlarged prostate with lower urinary tract symptoms: Secondary | ICD-10-CM | POA: Diagnosis not present

## 2020-10-10 MED ORDER — TAMSULOSIN HCL 0.4 MG PO CAPS: 0.4000 mg | ORAL_CAPSULE | Freq: Every day | ORAL | 3 refills | Status: DC

## 2021-11-06 ENCOUNTER — Other Ambulatory Visit: Payer: Self-pay | Admitting: Urology

## 2021-11-06 DIAGNOSIS — N401 Enlarged prostate with lower urinary tract symptoms: Secondary | ICD-10-CM

## 2023-05-13 ENCOUNTER — Ambulatory Visit: Payer: Medicare Other | Admitting: Gastroenterology

## 2023-06-16 ENCOUNTER — Encounter: Payer: Self-pay | Admitting: Emergency Medicine

## 2023-06-16 ENCOUNTER — Ambulatory Visit
Admission: EM | Admit: 2023-06-16 | Discharge: 2023-06-16 | Disposition: A | Discharge status: Home / Self Care | Payer: Medicare Other | Attending: Physician Assistant | Admitting: Physician Assistant

## 2023-06-16 DIAGNOSIS — N400 Enlarged prostate without lower urinary tract symptoms: Secondary | ICD-10-CM | POA: Insufficient documentation

## 2023-06-16 DIAGNOSIS — K2100 Gastro-esophageal reflux disease with esophagitis, without bleeding: Secondary | ICD-10-CM | POA: Insufficient documentation

## 2023-06-16 DIAGNOSIS — R142 Eructation: Secondary | ICD-10-CM | POA: Diagnosis not present

## 2023-06-16 DIAGNOSIS — R140 Abdominal distension (gaseous): Secondary | ICD-10-CM | POA: Diagnosis not present

## 2023-06-16 DIAGNOSIS — I2510 Atherosclerotic heart disease of native coronary artery without angina pectoris: Secondary | ICD-10-CM | POA: Insufficient documentation

## 2023-06-16 DIAGNOSIS — K5730 Diverticulosis of large intestine without perforation or abscess without bleeding: Secondary | ICD-10-CM | POA: Insufficient documentation

## 2023-06-16 DIAGNOSIS — R739 Hyperglycemia, unspecified: Secondary | ICD-10-CM | POA: Insufficient documentation

## 2023-06-16 LAB — CBC WITH DIFFERENTIAL/PLATELET
Abs Immature Granulocytes: 0.03 10*3/uL (ref 0.00–0.07)
Basophils Absolute: 0.1 10*3/uL (ref 0.0–0.1)
Basophils Relative: 1 %
Eosinophils Absolute: 0.2 10*3/uL (ref 0.0–0.5)
Eosinophils Relative: 2 %
HCT: 39.1 % (ref 39.0–52.0)
Hemoglobin: 13.1 g/dL (ref 13.0–17.0)
Immature Granulocytes: 0 %
Lymphocytes Relative: 20 %
Lymphs Abs: 1.8 10*3/uL (ref 0.7–4.0)
MCH: 29.5 pg (ref 26.0–34.0)
MCHC: 33.5 g/dL (ref 30.0–36.0)
MCV: 88.1 fL (ref 80.0–100.0)
Monocytes Absolute: 0.6 10*3/uL (ref 0.1–1.0)
Monocytes Relative: 7 %
Neutro Abs: 6.3 10*3/uL (ref 1.7–7.7)
Neutrophils Relative %: 70 %
Platelets: 299 10*3/uL (ref 150–400)
RBC: 4.44 MIL/uL (ref 4.22–5.81)
RDW: 13.7 % (ref 11.5–15.5)
WBC: 9 10*3/uL (ref 4.0–10.5)
nRBC: 0 % (ref 0.0–0.2)

## 2023-06-16 LAB — LIPASE, BLOOD: Lipase: 22 U/L (ref 11–51)

## 2023-06-16 LAB — COMPREHENSIVE METABOLIC PANEL
ALT: 22 U/L (ref 0–44)
AST: 19 U/L (ref 15–41)
Albumin: 4.2 g/dL (ref 3.5–5.0)
Alkaline Phosphatase: 76 U/L (ref 38–126)
Anion gap: 11 (ref 5–15)
BUN: 23 mg/dL (ref 8–23)
CO2: 24 mmol/L (ref 22–32)
Calcium: 8.6 mg/dL — ABNORMAL LOW (ref 8.9–10.3)
Chloride: 100 mmol/L (ref 98–111)
Creatinine, Ser: 1.13 mg/dL (ref 0.61–1.24)
GFR, Estimated: >60 mL/min (ref >60)
Glucose, Bld: 144 mg/dL — ABNORMAL HIGH (ref 70–99)
Potassium: 3.9 mmol/L (ref 3.5–5.1)
Sodium: 135 mmol/L (ref 135–145)
Total Bilirubin: 1.3 mg/dL — ABNORMAL HIGH (ref <1.2)
Total Protein: 8.1 g/dL (ref 6.5–8.1)

## 2023-06-16 NOTE — ED Triage Notes (Signed)
Patient reports abdominal pain and bloating that started a week ago. Patient denies N/V/D.  Patient denies fevers.

## 2023-06-16 NOTE — Discharge Instructions (Signed)
-  We have obtained some blood work today.  I will call you once I get those results.  I will also be available through MyChart and your PCP should be able to access it tomorrow.  Keep your appointment with your primary care provider tomorrow.  Try to push for getting a CT of your abdomen and to get on a cancellation list to see GI if possible. - Try to avoid foods that you know increase the belching, bloating and burping. - Increase rest and fluids.  Continue Gas-X.

## 2023-06-16 NOTE — ED Provider Notes (Signed)
[facility]    CSN: [id number] Arrival date & time: 06/16/23  0948      History   Chief Complaint Chief Complaint  Patient presents with   Abdominal Pain    HPI Eric Dickson. is a 77 y.o. male presenting with son for GI concerns.  Patient reports belching for awhile. States it has recently worsened and he also reports abdominal bloating after eating. Reports normal formed stools everyday. He reports a loss of appetite and weight loss for 1-2 weeks. He says he feels like he has to force himself to eat. Denies abdominal pain but says abdomen feels hard at times. He says he does not feel too bad today but has not eaten today. He reports eating scrambled eggs and something yesterday which led to "stomach tightness all day long." Has been taking Gas-X but does not think it helps.   He denies fever, fatigue, vomiting, diarrhea, constipation, difficulty or pain with urinating.   Patient reports a long history of abdominal issues and diarrhea. He says he also has abdominal issues due to BPH.  Patient reports his last EGD and colonoscopy 3 years ago and says it was normal. Patient says he cannot see GI until February. Has an appointment with PCP tomorrow.   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   Precordial pain 05/22/2018   Heart burn 05/13/2018   Orthostatic hypotension 05/06/2018   Near syncope 04/29/2018   Enlarged heart 05/14/2017   BPH associated with nocturia 10/30/2016   Need for vaccination 10/30/2016   Other acne 10/30/2016   Incomplete bladder emptying 04/03/2016   H/O urinary retention  07/04/2015   Elevated PSA 07/04/2015   Benign essential hypertension 02/27/2011   Diastasis of muscle 02/27/2011   Gout, unspecified 02/27/2011   Mixed hyperlipidemia 02/27/2011   Other psoriasis 02/27/2011   Prostatitis 02/27/2011   Shoulder joint pain 02/27/2011   Rosacea 02/27/2011    Past Surgical History:  Procedure Laterality Date   COLONOSCOPY WITH PROPOFOL     COLONOSCOPY WITH PROPOFOL N/A 10/07/2018   Procedure: COLONOSCOPY WITH PROPOFOL;  Surgeon: Toledo, Boykin Nearing, MD;  Location: [facility];  Service: Gastroenterology;  Laterality: N/A;   ESOPHAGOGASTRODUODENOSCOPY (EGD) WITH PROPOFOL N/A 10/07/2018   Procedure: ESOPHAGOGASTRODUODENOSCOPY (EGD) WITH PROPOFOL;  Surgeon: Toledo, Boykin Nearing, MD;  Location: [facility];  Service: Gastroenterology;  Laterality: N/A;   hydrostent removal         Home Medications    Prior to Admission medications   Medication Sig Start Date End Date Taking? Authorizing Provider  amLODipine (NORVASC) 5 MG tablet Take 1 tablet by mouth 2 (two) times daily. 12/21/19 06/16/23 Yes [provider]  aspirin EC 81 MG EC tablet Take 1 tablet (81 mg total) by mouth daily. 05/14/18  Yes Shaune Pollack, MD  atorvastatin (LIPITOR) 20 MG tablet Take 20 mg by mouth daily.  10/31/15  Yes [provider]  carvedilol (COREG) 12.5 MG tablet Take 12.5 mg by mouth 2 (two) times daily. 09/08/20  Yes [provider]  citalopram (CELEXA) 10 MG tablet Take 1 tablet by mouth daily. 12/21/19 06/16/23 Yes [provider]  lisinopril (ZESTRIL) 30 MG tablet Take 1 tablet by mouth 2 (two) times daily. 12/21/19 06/16/23 Yes [provider]  montelukast (SINGULAIR) 10 MG tablet Take 1 tablet by mouth at bedtime. 06/12/23  Yes [provider]  omega-3 acid ethyl esters (LOVAZA) 1 g capsule Take 1 g by mouth daily.   Yes [provider]  tamsulosin (FLOMAX) 0.4 MG CAPS capsule Take 1 capsule (0.4 mg total) by mouth daily.  10/10/20  Yes McGowan, Carollee Herter A, PA-C  chlorthalidone (HYGROTON) 25 MG tablet Take 1 tablet by mouth daily. 09/01/20 09/01/21  [provider]  finasteride (PROSCAR) 5 MG tablet TAKE (1) TABLET BY MOUTH EVERY DAY 08/09/20   McGowan, Wellington Hampshire, PA-C    Family History Family History  Problem Relation Age of Onset   COPD Mother    Heart disease Father    Kidney disease Neg Hx    Prostate cancer Neg Hx    Kidney cancer Neg Hx    Bladder Cancer Neg Hx     Social History Social History   Tobacco Use   Smoking status: Never   Smokeless tobacco: Former    Types: Chew    Quit date: 1993  Vaping Use   Vaping status: Never Used  Substance Use Topics   Alcohol use: No    Alcohol/week: 0.0 standard drinks of alcohol   Drug use: No     Allergies   Patient has no known allergies.   Review of Systems Review of Systems  Constitutional:  Positive for appetite change and unexpected weight change. Negative for fatigue and fever.  Cardiovascular:  Negative for chest pain.  Gastrointestinal:  Positive for abdominal distention and nausea. Negative for abdominal pain, blood in stool, constipation, diarrhea, rectal pain and vomiting.  Genitourinary:  Negative for difficulty urinating, dysuria, flank pain, frequency and hematuria.  Musculoskeletal:  Negative for back pain.  Neurological:  Negative for weakness.     Physical Exam Triage Vital Signs ED Triage Vitals  Encounter Vitals Group     BP 06/16/23 1055 (!) 153/73     Systolic BP Percentile --      Diastolic BP Percentile --      Pulse Rate 06/16/23 1055 82     Resp 06/16/23 1055 15     Temp 06/16/23 1055 98 F (36.7 C)     Temp Source 06/16/23 1055 Oral     SpO2 06/16/23 1055 96 %     Weight 06/16/23 1053 223 lb 15.8 oz (101.6 kg)     Height 06/16/23 1053 5\' 9"  (1.753 m)     Head Circumference --      Peak Flow --      Pain Score 06/16/23 1052 4     Pain Loc --      Pain Education --      Exclude from Growth Chart  --    No data found.  Updated Vital Signs BP (!) 153/73 (BP Location: Left Arm)   Pulse 82   Temp 98 F (36.7 C) (Oral)   Resp 15   Ht 5\' 9"  (1.753 m)   Wt 223 lb 15.8 oz (101.6 kg)   SpO2 96%   BMI 33.08 kg/m     Physical Exam Vitals and nursing note reviewed.  Constitutional:      General: He is not in acute distress.  Appearance: Normal appearance. He is well-developed. He is not ill-appearing.  HENT:     Head: Normocephalic and atraumatic.  Eyes:     General: No scleral icterus.    Conjunctiva/sclera: Conjunctivae normal.  Cardiovascular:     Rate and Rhythm: Normal rate and regular rhythm.     Heart sounds: Normal heart sounds.  Pulmonary:     Effort: Pulmonary effort is normal. No respiratory distress.     Breath sounds: Normal breath sounds.  Abdominal:     General: Abdomen is protuberant.     Palpations: Abdomen is soft.     Tenderness: There is no abdominal tenderness. There is no right CVA tenderness, left CVA tenderness, guarding or rebound.     Hernia: A hernia is present. Hernia is present in the ventral area.  Musculoskeletal:        General: No swelling.     Cervical back: Neck supple.  Skin:    General: Skin is warm and dry.     Capillary Refill: Capillary refill takes less than 2 seconds.  Neurological:     General: No focal deficit present.     Mental Status: He is alert. Mental status is at baseline.     Motor: No weakness.     Gait: Gait normal.  Psychiatric:        Mood and Affect: Mood normal.        Behavior: Behavior normal.      UC Treatments / Results  Labs (all labs ordered are listed, but only abnormal results are displayed) Labs Reviewed  COMPREHENSIVE METABOLIC PANEL - Abnormal; Notable for the following components:      Result Value   Glucose, Bld 144 (*)    Calcium 8.6 (*)    Total Bilirubin 1.3 (*)    All other components within normal limits  CBC WITH DIFFERENTIAL/PLATELET  LIPASE, BLOOD     EKG   Radiology No results found.  Study Result  Narrative & Impression  CLINICAL DATA:  Belching and 8 lb weight loss in 5 days. The patient reports that he has a hiatal hernia.   EXAM: CT ABDOMEN AND PELVIS WITH CONTRAST   TECHNIQUE: Multidetector CT imaging of the abdomen and pelvis was performed using the standard protocol following bolus administration of intravenous contrast.   CONTRAST:  OMNIPAQUE IOHEXOL 300 MG/ML  SOLN   COMPARISON:  Renal ultrasound dated 01/14/2017.   FINDINGS: Lower chest: Clear lung bases. Coronary artery calcifications.   Hepatobiliary: No focal liver abnormality is seen. No gallstones, gallbladder wall thickening, or biliary dilatation.   Pancreas: Unremarkable. No pancreatic ductal dilatation or surrounding inflammatory changes.   Spleen: Normal in size without focal abnormality.   Adrenals/Urinary Tract: Normal appearing adrenal glands. Small bilateral renal cysts. Normal appearing ureters and urinary bladder.   Stomach/Bowel: Small number of descending and proximal sigmoid colon diverticula without evidence of diverticulitis. The remainder the colon has a normal appearance. Normal appearing appendix, small bowel and stomach. No visible hiatal hernia. Mild diffuse distal esophageal wall thickening.   Vascular/Lymphatic: Atheromatous arterial calcifications without aneurysm. No enlarged lymph nodes.   Reproductive: Moderately enlarged prostate gland.   Other: No abdominal wall hernia or abnormality. No abdominopelvic ascites.   Musculoskeletal: Lower thoracic spine degenerative changes and mild lumbar spine degenerative changes. Multiple vertebral hemangiomas.   IMPRESSION: 1. Mild diffuse distal esophageal wall thickening. This may be due to reflux esophagitis. A distal esophageal neoplasm is less likely. Correlation with upper endoscopy is recommended. 2.  Mild descending and proximal sigmoid colon  diverticulosis. 3. Moderately enlarged prostate gland. 4. Coronary artery atherosclerosis.    Procedures Procedures (including critical care time)  Medications Ordered in UC Medications - No data to display  Initial Impression / Assessment and Plan / UC Course  I have reviewed the triage vital signs and the nursing notes.  Pertinent labs & imaging results that were available during my care of the patient were reviewed by me and considered in my medical decision making (see chart for details).   77 year old male presents for belching, burping, abdominal bloating and distention "for a while."  He says symptoms of gotten worse over the past weekend he reports reduced appetite.  Has had the same symptoms in 2020.  He had a CT performed at that time which showed diverticulosis and esophagitis.  He also had EGD and colonoscopy.  GI specialist reported there were no concerns for malignancy.  He has been taking Gas-X which has helped and he has been avoiding foods that cause increased belching and burping but says he still does not feel well and he cannot see GI for several more months.  He has an appointment with his primary care provider tomorrow.  Vitals are stable.  He is overall well-appearing.  Abdomen is protuberant but soft.  He has a ventral hernia.  No abdominal tenderness, guarding or rebound.  Chest clear.  Will obtain CBC, CMP and lipase.  Will contact patient with results.  Advised patient to follow-up with primary care provider tomorrow.  He likely needs to have another CT scan performed and try to get into see GI sooner.  At this time encouraged him to continue Gas-X, increasing rest and fluids and avoiding triggers.  Lab work all reassuring.  Glucose is 144 and patient states that he is fasting.  I discussed all of his results with him.  He has an appoint with his PCP tomorrow.  Encouraged him to discuss his elevated blood sugar with his doctor.  Hopefully they will order an A1c.   Patient does not think he has a history of prediabetes or diabetes.   Final Clinical Impressions(s) / UC Diagnoses   Final diagnoses:  Postprandial abdominal bloating  Belching  Hyperglycemia     Discharge Instructions      -We have obtained some blood work today.  I will call you once I get those results.  I will also be available through MyChart and your PCP should be able to access it tomorrow.  Keep your appointment with your primary care provider tomorrow.  Try to push for getting a CT of your abdomen and to get on a cancellation list to see GI if possible. - Try to avoid foods that you know increase the belching, bloating and burping. - Increase rest and fluids.  Continue Gas-X.     ED Prescriptions   None    PDMP not reviewed this encounter.   Shirlee Latch, PA-C 06/16/23 1345

## 2023-06-17 ENCOUNTER — Other Ambulatory Visit: Payer: Self-pay | Admitting: Family Medicine

## 2023-06-17 DIAGNOSIS — R634 Abnormal weight loss: Secondary | ICD-10-CM

## 2023-06-17 DIAGNOSIS — R140 Abdominal distension (gaseous): Secondary | ICD-10-CM

## 2023-06-17 DIAGNOSIS — R1084 Generalized abdominal pain: Secondary | ICD-10-CM

## 2023-06-18 ENCOUNTER — Ambulatory Visit
Admission: RE | Admit: 2023-06-18 | Discharge: 2023-06-18 | Disposition: A | Discharge status: Home / Self Care | Payer: Medicare Other | Source: Ambulatory Visit | Attending: Family Medicine | Admitting: Family Medicine

## 2023-06-18 DIAGNOSIS — R634 Abnormal weight loss: Secondary | ICD-10-CM | POA: Diagnosis present

## 2023-06-18 DIAGNOSIS — R140 Abdominal distension (gaseous): Secondary | ICD-10-CM | POA: Insufficient documentation

## 2023-06-18 DIAGNOSIS — R1084 Generalized abdominal pain: Secondary | ICD-10-CM | POA: Insufficient documentation

## 2023-06-18 MED ORDER — IOHEXOL 300 MG/ML  SOLN
100.0000 mL | Freq: Once | INTRAMUSCULAR | Status: AC | PRN
  Administered 2023-06-18: 100 mL via INTRAVENOUS

## 2023-06-27 NOTE — Progress Notes (Signed)
07/01/2023 3:31 PM   Eric Dickson. [birth year] [id number]  Referring provider: Dione Housekeeper, MD [address] [place],  [place] [address]  Urological history: 1. BPH with LU TS -PSA (2020) 0.82 -Tamsulosin 0.4 mg daily and finasteride 5 mg daily  2. Renal cysts -CT (06/2023) - Simple, benign bilateral renal cortical cysts  Chief Complaint  Patient presents with   Benign Prostatic Hypertrophy   HPI: Eric Dickson. is a 77 y.o. male who presents today for medication refills.   Previous records reviewed.   I PSS 20/5  PVR 131 mL   He has been having issues with difficulty urinating and a weak urinary stream that has been getting worse over the last year.  He has been taking the tamsulosin 0.4 mg daily, but he quit the finasteride 5 mg daily probably over 2 years ago as he felt why did he need to take 2 medications for his prostate.  Patient denies any modifying or aggravating factors.  Patient denies any recent UTI's, gross hematuria, dysuria or suprapubic/flank pain.  Patient denies any fevers, chills, nausea or vomiting.    He states he also feels like his prostate is pushing up on him when he is riding around in a car.  CT (06/2023) - prostate volume 140 cc  He also has been having issues with bloating, decreased appetite and belching, but he cannot see GI doctor until June.   IPSS     Row Name 07/01/23 1400         International Prostate Symptom Score   How often have you had the sensation of not emptying your bladder? About half the time     How often have you had to urinate less than every two hours? Less than 1 in 5 times     How often have you found you stopped and started again several times when you urinated? About half the time     How often have you found it difficult to postpone urination? About half the time     How often have you had a weak urinary stream? Almost always     How often have you had to strain to start  urination? More than half the time     How many times did you typically get up at night to urinate? 1 Time     Total IPSS Score 20       Quality of Life due to urinary symptoms   If you were to spend the rest of your life with your urinary condition just the way it is now how would you feel about that? Unhappy              Score:  1-7 Mild 8-19 Moderate 20-35 Severe     PMH: Past Medical History:  Diagnosis Date   BPH (benign prostatic hypertrophy)    Diastasis, muscle    Gout    HLD (hyperlipidemia)    HTN (hypertension)    Microscopic hematuria    Obesity    Prostatitis    Psoriasis    Renal mass    Dromedary hump   Rosacea    Shoulder pain    Urinary retention     Surgical History: Past Surgical History:  Procedure Laterality Date   COLONOSCOPY WITH PROPOFOL     COLONOSCOPY WITH PROPOFOL N/A 10/07/2018   Procedure: COLONOSCOPY WITH PROPOFOL;  Surgeon: Toledo, Boykin Nearing, MD;  Location: [facility] ENDOSCOPY;  Service: Gastroenterology;  Laterality:  N/A;   ESOPHAGOGASTRODUODENOSCOPY (EGD) WITH PROPOFOL N/A 10/07/2018   Procedure: ESOPHAGOGASTRODUODENOSCOPY (EGD) WITH PROPOFOL;  Surgeon: Toledo, Boykin Nearing, MD;  Location: [facility] ENDOSCOPY;  Service: Gastroenterology;  Laterality: N/A;   hydrostent removal      Home Medications:  Allergies as of 07/01/2023   No Known Allergies      Medication List        Accurate as of July 01, 2023  3:31 PM. If you have any questions, ask your nurse or doctor.          STOP taking these medications    aspirin EC 81 MG tablet Stopped by: Deagen Krass   omega-3 acid ethyl esters 1 g capsule Commonly known as: LOVAZA Stopped by: Shawnta Zimbelman       TAKE these medications    amLODipine 5 MG tablet Commonly known as: NORVASC Take 1 tablet by mouth 2 (two) times daily.   atorvastatin 40 MG tablet Commonly known as: LIPITOR Take 40 mg by mouth daily. What changed: Another medication with the same name was  removed. Continue taking this medication, and follow the directions you see here. Changed by: Michiel Cowboy   carvedilol 12.5 MG tablet Commonly known as: COREG Take 12.5 mg by mouth 2 (two) times daily.   chlorthalidone 25 MG tablet Commonly known as: HYGROTON Take 1 tablet by mouth daily. What changed: Another medication with the same name was removed. Continue taking this medication, and follow the directions you see here. Changed by: Michiel Cowboy   citalopram 20 MG tablet Commonly known as: CELEXA Take 20 mg by mouth daily. What changed: Another medication with the same name was removed. Continue taking this medication, and follow the directions you see here. Changed by: Michiel Cowboy   finasteride 5 MG tablet Commonly known as: Proscar Take 1 tablet (5 mg total) by mouth daily. What changed: See the new instructions. Changed by: Carollee Herter Oneta Sigman   lisinopril 30 MG tablet Commonly known as: ZESTRIL Take 1 tablet by mouth 2 (two) times daily.   montelukast 10 MG tablet Commonly known as: SINGULAIR Take 1 tablet by mouth at bedtime.   tamsulosin 0.4 MG Caps capsule Commonly known as: FLOMAX Take 0.8 mg by mouth daily. What changed: Another medication with the same name was removed. Continue taking this medication, and follow the directions you see here. Changed by: Michiel Cowboy        Allergies: No Known Allergies  Family History: Family History  Problem Relation Age of Onset   COPD Mother    Heart disease Father    Kidney disease Neg Hx    Prostate cancer Neg Hx    Kidney cancer Neg Hx    Bladder Cancer Neg Hx     Social History:  reports that he has never smoked. He quit smokeless tobacco use about 31 years ago.  His smokeless tobacco use included chew. He reports that he does not drink alcohol and does not use drugs.  ROS: Pertinent ROS in HPI  Physical Exam: BP (!) 149/74 (BP Location: Left Arm, Patient Position: Sitting, Cuff Size: Large)    Pulse 92   Ht 5\' 9"  (1.753 m)   Wt 228 lb (103.4 kg)   BMI 33.67 kg/m   Constitutional:  Well nourished. Alert and oriented, No acute distress. HEENT: [place] AT, moist mucus membranes.  Trachea midline Cardiovascular: No clubbing, cyanosis, or edema. Respiratory: Normal respiratory effort, no increased work of breathing. Neurologic: Grossly intact, no focal deficits, moving all 4  extremities. Psychiatric: Normal mood and affect.  Laboratory Data: Lab Results  Component Value Date   WBC 8.5 06/28/2023   HGB 12.5 (L) 06/28/2023   HCT 37.3 (L) 06/28/2023   MCV 88.4 06/28/2023   PLT 288 06/28/2023    Lab Results  Component Value Date   CREATININE 0.99 06/28/2023    Lab Results  Component Value Date   PSA 0.79 01/02/2017   Lab Results  Component Value Date   AST 21 06/28/2023   Lab Results  Component Value Date   ALT 30 06/28/2023  I have reviewed the labs.   Pertinent Imaging: CLINICAL DATA:  Abdominal pain, bloating, unintended weight loss   EXAM: CT CHEST, ABDOMEN, AND PELVIS WITH CONTRAST   TECHNIQUE: Multidetector CT imaging of the chest, abdomen and pelvis was performed following the standard protocol during bolus administration of intravenous contrast.   RADIATION DOSE REDUCTION: This exam was performed according to the departmental dose-optimization program which includes automated exposure control, adjustment of the mA and/or kV according to patient size and/or use of iterative reconstruction technique.   CONTRAST:  OMNIPAQUE IOHEXOL 300 MG/ML  SOLN   COMPARISON:  CT abdomen pelvis, 08/28/2018   FINDINGS: CT CHEST FINDINGS   Cardiovascular: Aortic atherosclerosis. Normal heart size. Extensive three-vessel coronary artery calcifications no pericardial effusion.   Mediastinum/Nodes: No enlarged mediastinal, hilar, or axillary lymph nodes. Thyroid gland, trachea, and esophagus demonstrate no significant findings.   Lungs/Pleura: Bandlike  scarring of the left lung base. No pleural effusion or pneumothorax.   Musculoskeletal: No chest wall abnormality. No acute osseous findings.   CT ABDOMEN PELVIS FINDINGS   Hepatobiliary: No solid liver abnormality is seen. Hepatic steatosis. No gallstones, gallbladder wall thickening, or biliary dilatation.   Pancreas: Unremarkable. No pancreatic ductal dilatation or surrounding inflammatory changes.   Spleen: Normal in size without significant abnormality.   Adrenals/Urinary Tract: Adrenal glands are unremarkable. Simple, benign bilateral renal cortical cysts, for which no further follow-up or characterization is required. Kidneys are otherwise normal, without renal calculi, solid lesion, or hydronephrosis. Bladder is unremarkable.   Stomach/Bowel: Stomach is within normal limits. Appendix appears normal. No evidence of bowel wall thickening, distention, or inflammatory changes. Descending and sigmoid diverticulosis.   Vascular/Lymphatic: Aortic atherosclerosis. Single enlarged left external iliac lymph node measuring up to 1.7 x 1.3 cm, unchanged compared to examination dated 2020, benign and reactive for which no further follow-up or characterization is required (series 2, image 113). No other enlarged abdominal or pelvic lymph nodes.   Reproductive: Prostatomegaly.   Other: No abdominal wall hernia or abnormality. No ascites.   Musculoskeletal: No acute osseous findings.   IMPRESSION: 1. No CT findings of the chest, abdomen, or pelvis to explain abdominal pain, bloating, or unintended weight loss. 2. Sigmoid diverticulosis without evidence of acute diverticulitis. 3. Hepatic steatosis. 4. Prostatomegaly. 5. Coronary artery disease.   Aortic Atherosclerosis (ICD10-I70.0).     Electronically Signed   By: Jearld Lesch M.D.   On: 06/18/2023 13:42 I have independently reviewed the films.    Assessment & Plan:    1. BPH with LUTS -PVR < 300 cc  -symptoms  -weak stream, straining to urinate -continue conservative management, avoiding bladder irritants and timed voiding's -Explained that the finasteride was to reduce the size of his prostate and if he restarts the medication it will take 3 to 6 months to see benefit -Continue tamsulosin 0.4 mg daily -I discussed that he may want to undergo further evaluation for bladder outlet  obstruction with cystoscopy -Explained that the cystoscopy is a safe and common diagnostic test performed by one of our physicians in the office.  It consist of using a thin, lighted tube to look directly inside the bladder, prostate and urethra to evaluate the anatomy.  The procedure is brief, typically taking about 5 minutes. -This will enable [place] to assess bladder health, diagnose and enlarged prostate, assess which BPH procedure may be most appropriate and rule out other bladder conditions (stricture disease, stones, cancer, etc.)  -Advised the patient that there are no restrictions to eating or drinking prior to the cystoscopy -They can continue to take all of their medications as prescribed -They can drive themselves to and from the appointment -I explained that during the procedure, the area around the urethra will be cleansed thoroughly, topical anesthetic will be applied to numb your urethra, the thin tube is then gently inserted through the urethra into your bladder while fluid flows through the tube to the bladder to enable better visualization -I explained the procedure is usually not painful, however there may be some discomfort (pinching feeling), and they may feel an urge to urinate, coolness or fullness in the bladder and then the cystoscope is removed -After the cystoscopy, I advised them that they may experience urinary frequency, hematuria, dysuria which will resolve within 24 to 48 hours -Reviewed red flag signs (fever, bright red blood or blood clots in the urine, abdominal pain or difficulty urinating) and to  contact the office immediately or seek treatment in the ED if they should experience any of these -The physician will discuss the results of the cystoscopy at the time of the procedure  -His questions were answered and he is agreeable -Will go ahead and schedule cystoscopy for further evaluation of BOO   2. Bilateral renal cysts  -Benign -No further surveillance warranted   Return for return for cysto for BOO.  These notes generated with voice recognition software. I apologize for typographical errors.  Cloretta Ned  [facility] [address]  [address] [place], [place] [address] [phone]

## 2023-06-28 ENCOUNTER — Other Ambulatory Visit: Payer: Self-pay

## 2023-06-28 ENCOUNTER — Emergency Department
Admission: EM | Admit: 2023-06-28 | Discharge: 2023-06-28 | Disposition: A | Discharge status: Home / Self Care | Payer: Medicare Other | Attending: Emergency Medicine | Admitting: Emergency Medicine

## 2023-06-28 DIAGNOSIS — R109 Unspecified abdominal pain: Secondary | ICD-10-CM | POA: Diagnosis present

## 2023-06-28 DIAGNOSIS — N400 Enlarged prostate without lower urinary tract symptoms: Secondary | ICD-10-CM | POA: Diagnosis not present

## 2023-06-28 DIAGNOSIS — I10 Essential (primary) hypertension: Secondary | ICD-10-CM | POA: Insufficient documentation

## 2023-06-28 DIAGNOSIS — R102 Pelvic and perineal pain: Secondary | ICD-10-CM

## 2023-06-28 DIAGNOSIS — N401 Enlarged prostate with lower urinary tract symptoms: Secondary | ICD-10-CM

## 2023-06-28 LAB — URINALYSIS, ROUTINE W REFLEX MICROSCOPIC
Bacteria, UA: NONE SEEN
Bilirubin Urine: NEGATIVE
Glucose, UA: NEGATIVE mg/dL
Hgb urine dipstick: NEGATIVE
Ketones, ur: 20 mg/dL — AB
Nitrite: NEGATIVE
Protein, ur: NEGATIVE mg/dL
Specific Gravity, Urine: 1.014 (ref 1.005–1.030)
pH: 5 (ref 5.0–8.0)

## 2023-06-28 LAB — COMPREHENSIVE METABOLIC PANEL
ALT: 30 U/L (ref 0–44)
AST: 21 U/L (ref 15–41)
Albumin: 4.3 g/dL (ref 3.5–5.0)
Alkaline Phosphatase: 79 U/L (ref 38–126)
Anion gap: 12 (ref 5–15)
BUN: 20 mg/dL (ref 8–23)
CO2: 22 mmol/L (ref 22–32)
Calcium: 8.6 mg/dL — ABNORMAL LOW (ref 8.9–10.3)
Chloride: 99 mmol/L (ref 98–111)
Creatinine, Ser: 0.99 mg/dL (ref 0.61–1.24)
GFR, Estimated: >60 mL/min (ref >60)
Glucose, Bld: 125 mg/dL — ABNORMAL HIGH (ref 70–99)
Potassium: 3.6 mmol/L (ref 3.5–5.1)
Sodium: 133 mmol/L — ABNORMAL LOW (ref 135–145)
Total Bilirubin: 1.3 mg/dL — ABNORMAL HIGH (ref <1.2)
Total Protein: 8 g/dL (ref 6.5–8.1)

## 2023-06-28 LAB — CBC
HCT: 37.3 % — ABNORMAL LOW (ref 39.0–52.0)
Hemoglobin: 12.5 g/dL — ABNORMAL LOW (ref 13.0–17.0)
MCH: 29.6 pg (ref 26.0–34.0)
MCHC: 33.5 g/dL (ref 30.0–36.0)
MCV: 88.4 fL (ref 80.0–100.0)
Platelets: 288 10*3/uL (ref 150–400)
RBC: 4.22 MIL/uL (ref 4.22–5.81)
RDW: 13.3 % (ref 11.5–15.5)
WBC: 8.5 10*3/uL (ref 4.0–10.5)
nRBC: 0 % (ref 0.0–0.2)

## 2023-06-28 LAB — LIPASE, BLOOD: Lipase: 21 U/L (ref 11–51)

## 2023-06-28 NOTE — ED Provider Notes (Signed)
[facility] Provider Note    Event Date/Time   First MD Initiated Contact with Patient 06/28/23 1448     (approximate)   History   Abdominal Pain   HPI  Eric Dickson. is a 77 y.o. male   Past medical history of BPH, hypertension, hyperlipidemia, here with perineal discomfort and much weaker stream than normal.  Denies testicular pain.  No dysuria or frequency.  Bowel movements have been normal, he had a normal bowel movement today.  He has an appointment with urologist on Monday.  No high risk sexual activities, no penile discharge, no fevers or chills.  He does note that he has been belching more than normal.  No nausea or vomiting.  No chest pain.  Independent Historian contributed to assessment above: His wife is at bedside to corroborate information past medical history as above   External medical chart review-I reviewed the results of a CT chest abdomen and pelvis with contrast obtained on 06/18/2023 for bloating abdominal sensation which showed no significant findings aside from prostatomegaly and diverticulosis  Physical Exam   Triage Vital Signs: ED Triage Vitals  Encounter Vitals Group     BP 06/28/23 1341 (!) 174/78     Systolic BP Percentile --      Diastolic BP Percentile --      Pulse Rate 06/28/23 1341 83     Resp 06/28/23 1341 18     Temp 06/28/23 1341 98 F (36.7 C)     Temp Source 06/28/23 1341 Oral     SpO2 06/28/23 1341 98 %     Weight 06/28/23 1342 223 lb 15.8 oz (101.6 kg)     Height 06/28/23 1342 5\' 9"  (1.753 m)     Head Circumference --      Peak Flow --      Pain Score 06/28/23 1341 0     Pain Loc --      Pain Education --      Exclude from Growth Chart --     Most recent vital signs: Vitals:   06/28/23 1341  BP: (!) 174/78  Pulse: 83  Resp: 18  Temp: 98 F (36.7 C)  SpO2: 98%    General: Awake, no distress.  CV:  Good peripheral perfusion.  Resp:  Normal effort.  Abd:  No distention.   Other:  Awake alert comfortable appearing slightly hypertensive otherwise vital signs normal.  Abdomen is soft nontender deep palpation all quadrants.  Testicles appear normal, no swelling, no signs of skin infection.  His prostate feels enlarged, firm, not boggy and not tender.   ED Results / Procedures / Treatments   Labs (all labs ordered are listed, but only abnormal results are displayed) Labs Reviewed  COMPREHENSIVE METABOLIC PANEL - Abnormal; Notable for the following components:      Result Value   Sodium 133 (*)    Glucose, Bld 125 (*)    Calcium 8.6 (*)    Total Bilirubin 1.3 (*)    All other components within normal limits  CBC - Abnormal; Notable for the following components:   Hemoglobin 12.5 (*)    HCT 37.3 (*)    All other components within normal limits  URINALYSIS, ROUTINE W REFLEX MICROSCOPIC - Abnormal; Notable for the following components:   Color, Urine YELLOW (*)    APPearance CLEAR (*)    Ketones, ur 20 (*)    Leukocytes,Ua SMALL (*)    All other components within normal limits  LIPASE, BLOOD     I ordered and reviewed the above labs they are notable for white blood cell count is normal.  Urinalysis with no significant inflammatory changes or bacteria.     PROCEDURES:  Critical Care performed: No  Procedures   MEDICATIONS ORDERED IN ED: Medications - No data to display   IMPRESSION / MDM / ASSESSMENT AND PLAN / ED COURSE  I reviewed the triage vital signs and the nursing notes.                                Patient's presentation is most consistent with acute presentation with potential threat to life or bodily function.  Differential diagnosis includes, but is not limited to, prostatitis, urinary tract infection, enlarged prostate, urinary retention, considered but less likely surgical abdominal pathologies like obstruction or infection requiring surgery   The patient is on the cardiac monitor to evaluate for evidence of arrhythmia  and/or significant heart rate changes.  MDM:    This patient with enlarged prostate and weak stream and perineal discomfort as chief complaint.  Prostate exam not reflective of prostatitis, urinalysis not reflective of urinary tract infection, and postvoid residual is 35 mL.  He has had ongoing bloating burping sensation to his abdomen with a CT obtained last week with no acute findings, defer advanced imaging given benign abdominal exam recent imaging with no remarkable findings doubt life-threatening pelvic/abdominal pathologies at this time.  Not sure what is causing his symptoms.  He does have urology follow-up on Monday.  He will follow-up with his PMD.        FINAL CLINICAL IMPRESSION(S) / ED DIAGNOSES   Final diagnoses:  Pelvic pain  Benign prostatic hyperplasia with weak urinary stream     Rx / DC Orders   ED Discharge Orders     None        Note:  This document was prepared using Dragon voice recognition software and may include unintentional dictation errors.    Pilar Jarvis, MD 06/28/23 [phone]

## 2023-06-28 NOTE — ED Triage Notes (Signed)
Pt sts that he has been having abd pain for awhile. Pt sts that he has been having lower abd pain and he thinks that he has an inflamed prostate, which is causing low grade fevers and a weak urine stream.

## 2023-06-28 NOTE — Discharge Instructions (Signed)
Your evaluation in the emergency department did not show any emergency conditions to account for your symptoms.  There was no sign of urine infection, your exam did not show signs of prostate infection, and you are not retaining a significant amount of urine in your bladder to necessitate a Foley catheter.  Follow-up with your urologist as scheduled on Monday.  Thank you for choosing [place] for your health care today!  Please see your primary doctor this week for a follow up appointment.   If you have any new, worsening, or unexpected symptoms call your doctor right away or come back to the emergency department for reevaluation.  It was my pleasure to care for you today.   Daneil Dan Modesto Charon, MD

## 2023-07-01 ENCOUNTER — Encounter: Payer: Self-pay | Admitting: Urology

## 2023-07-01 ENCOUNTER — Ambulatory Visit: Payer: Medicare Other | Admitting: Urology

## 2023-07-01 VITALS — BP 149/74 | HR 92 | Ht 69.0 in | Wt 228.0 lb

## 2023-07-01 DIAGNOSIS — N281 Cyst of kidney, acquired: Secondary | ICD-10-CM | POA: Diagnosis not present

## 2023-07-01 DIAGNOSIS — N401 Enlarged prostate with lower urinary tract symptoms: Secondary | ICD-10-CM

## 2023-07-01 LAB — BLADDER SCAN AMB NON-IMAGING

## 2023-07-01 MED ORDER — FINASTERIDE 5 MG PO TABS: 5.0000 mg | ORAL_TABLET | Freq: Every day | ORAL | 3 refills | Status: DC

## 2023-07-01 NOTE — Patient Instructions (Signed)

## 2023-07-22 ENCOUNTER — Encounter: Payer: Self-pay | Admitting: Urology

## 2023-07-22 ENCOUNTER — Ambulatory Visit: Payer: Medicare Other | Admitting: Urology

## 2023-07-22 ENCOUNTER — Ambulatory Visit
Admission: RE | Admit: 2023-07-22 | Discharge: 2023-07-22 | Disposition: A | Discharge status: Home / Self Care | Payer: Medicare Other | Source: Ambulatory Visit | Attending: Family Medicine | Admitting: Family Medicine

## 2023-07-22 VITALS — BP 143/66 | HR 83 | Ht 69.0 in | Wt 228.0 lb

## 2023-07-22 VITALS — BP 143/66 | HR 83 | Temp 98.2°F | Resp 18

## 2023-07-22 DIAGNOSIS — N401 Enlarged prostate with lower urinary tract symptoms: Secondary | ICD-10-CM | POA: Diagnosis not present

## 2023-07-22 DIAGNOSIS — N138 Other obstructive and reflux uropathy: Secondary | ICD-10-CM

## 2023-07-22 DIAGNOSIS — K5909 Other constipation: Secondary | ICD-10-CM | POA: Diagnosis not present

## 2023-07-22 LAB — BLADDER SCAN AMB NON-IMAGING

## 2023-07-22 LAB — URINALYSIS, W/ REFLEX TO CULTURE (INFECTION SUSPECTED)
Bilirubin Urine: NEGATIVE
Glucose, UA: NEGATIVE mg/dL
Hgb urine dipstick: NEGATIVE
Ketones, ur: NEGATIVE mg/dL
Nitrite: NEGATIVE
Protein, ur: 30 mg/dL — AB
Specific Gravity, Urine: 1.02 (ref 1.005–1.030)
pH: 7 (ref 5.0–8.0)

## 2023-07-22 NOTE — Progress Notes (Signed)
07/22/2023 11:27 AM   Eric Dickson. [birth year] [id number]  Referring provider: Dione Housekeeper, MD [address] [place],  [place] [address]  Urological history: 1. BPH with LU TS -PSA (2020) 0.82 -Tamsulosin 0.4 mg daily and finasteride 5 mg daily  2. Renal cysts -CT (06/2023) - Simple, benign bilateral renal cortical cysts  Chief Complaint  Patient presents with   Urinary Retention   HPI: Eric Dickson. is a 77 y.o. male who presents today for medication refills.   Previous records reviewed.   At his visit on 07/01/2023, I PSS 20/5.  PVR 131 mL.  He has been having issues with difficulty urinating and a weak urinary stream that has been getting worse over the last year.  He has been taking the tamsulosin 0.4 mg daily, but he quit the finasteride 5 mg daily probably over 2 years ago as he felt why did he need to take 2 medications for his prostate.  Patient denies any modifying or aggravating factors.  Patient denies any recent UTI's, gross hematuria, dysuria or suprapubic/flank pain.  Patient denies any fevers, chills, nausea or vomiting.    He states he also feels like his prostate is pushing up on him when he is riding around in a car.  CT (06/2023) - prostate volume 140 cc.  He also has been having issues with bloating, decreased appetite and belching, but he cannot see GI doctor until June.   He was instructed to continue the tamsulosin 0.4 mg daily and he was scheduled for cystoscopy for further evaluation of BOO and it is scheduled for this Friday.  He presented to [facility] urgent care this morning with the complaints of urgency, frequency and nocturia.  His UA was negative in the urgent care.  He states for 3 to 4 days he has been having the urgency and frequency.  He states he is voiding just small amounts.  He also been having issues with constipation.  Patient denies any modifying or aggravating factors.  Patient denies any recent UTI's, gross  hematuria, dysuria or suprapubic/flank pain.  Patient denies any fevers, chills, nausea or vomiting.    Bladder scan notes residual 6 mL  PMH: Past Medical History:  Diagnosis Date   BPH (benign prostatic hypertrophy)    Diastasis, muscle    Gout    HLD (hyperlipidemia)    HTN (hypertension)    Microscopic hematuria    Obesity    Prostatitis    Psoriasis    Renal mass    Dromedary hump   Rosacea    Shoulder pain    Urinary retention     Surgical History: Past Surgical History:  Procedure Laterality Date   COLONOSCOPY WITH PROPOFOL     COLONOSCOPY WITH PROPOFOL N/A 10/07/2018   Procedure: COLONOSCOPY WITH PROPOFOL;  Surgeon: Toledo, Boykin Nearing, MD;  Location: [facility] ENDOSCOPY;  Service: Gastroenterology;  Laterality: N/A;   ESOPHAGOGASTRODUODENOSCOPY (EGD) WITH PROPOFOL N/A 10/07/2018   Procedure: ESOPHAGOGASTRODUODENOSCOPY (EGD) WITH PROPOFOL;  Surgeon: Toledo, Boykin Nearing, MD;  Location: [facility] ENDOSCOPY;  Service: Gastroenterology;  Laterality: N/A;   hydrostent removal      Home Medications:  Allergies as of 07/22/2023   No Known Allergies      Medication List      Notice   This visit is during an admission. Changes to the med list made in this visit will be reflected in the After Visit Summary of the admission.     Allergies: No  Known Allergies  Family History: Family History  Problem Relation Age of Onset   COPD Mother    Heart disease Father    Kidney disease Neg Hx    Prostate cancer Neg Hx    Kidney cancer Neg Hx    Bladder Cancer Neg Hx     Social History:  reports that he has never smoked. He quit smokeless tobacco use about 31 years ago.  His smokeless tobacco use included chew. He reports that he does not drink alcohol and does not use drugs.  ROS: Pertinent ROS in HPI  Physical Exam: BP (!) 143/66 (BP Location: Left Arm, Patient Position: Sitting)   Pulse 83   Ht 5\' 9"  (1.753 m)   Wt 228 lb (103.4 kg)   BMI 33.67 kg/m   Constitutional:  Well  nourished. Alert and oriented, No acute distress. HEENT: [place] AT, moist mucus membranes.  Trachea midline Cardiovascular: No clubbing, cyanosis, or edema. Respiratory: Normal respiratory effort, no increased work of breathing. Neurologic: Grossly intact, no focal deficits, moving all 4 extremities. Psychiatric: Normal mood and affect.   Laboratory Data: Component     Latest Ref Rng 07/22/2023  Color, Urine     YELLOW  YELLOW   Appearance     CLEAR  CLEAR   Specific Gravity, Urine     1.005 - 1.030  1.020   pH     5.0 - 8.0  7.0   Glucose, UA     NEGATIVE mg/dL NEGATIVE   Hgb urine dipstick     NEGATIVE  NEGATIVE   Bilirubin Urine     NEGATIVE  NEGATIVE   Ketones, ur     NEGATIVE mg/dL NEGATIVE   Protein     NEGATIVE mg/dL 30 !   Nitrite     NEGATIVE  NEGATIVE   Leukocytes,Ua     NEGATIVE  TRACE !   RBC / HPF     0 - 5 RBC/hpf 0-5   WBC, UA     0 - 5 WBC/hpf 6-10   Bacteria, UA     NONE SEEN  FEW !   Squamous Epithelial / HPF     0 - 5 /HPF 0-5   Mucus PRESENT   Specimen Source URINE, CLEAN CATCH     Legend: ! Abnormal I have reviewed the labs.   Pertinent Imaging:  07/22/23 11:05  Scan Result 6ml   Assessment & Plan:    1. BPH with LUTS -PVR < 300 cc  -symptoms -weak stream, straining to urinate -continue conservative management, avoiding bladder irritants and timed voiding's -Continue finasteride 5 mg daily -Increase tamsulosin to 2 tablets daily, 0.8 mg daily  -Encouraged to keep his appointment Friday for his cystoscopy  2. Bilateral renal cysts  -Benign -No further surveillance warranted  3. Constipation -Explained that his constipation may be aggravating his prostate and bladder and advised him to take a laxative to facilitate a bowel movement   Return for Friday for cysto .  These notes generated with voice recognition software. I apologize for typographical errors.  Cloretta Ned  [facility] [address]  [address] [place], [place] [address] [phone]

## 2023-07-22 NOTE — ED Provider Notes (Signed)
[facility]    CSN: [id number] Arrival date & time: 07/22/23  0932      History   Chief Complaint Chief Complaint  Patient presents with   Urinary Frequency    may have urinary tract infection due to enlarged prostrate - Entered by patient    HPI Eric Dickson. is a 77 y.o. male.   Patient came in with a complaint of not being able to void due to BPH.  Patient is on finasteride and Flomax.  It is not helping.  Patient has cystoscopy scheduled this Friday but patient cannot wait until then.  Patient is not drinking enough water because he is afraid that he will not be able to go to the bathroom.  Does have constipation as well but that is currently being evaluated by GI.  Wants to see if catheter could be placed to remove the obstruction.   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   Precordial pain 05/22/2018   Heart burn 05/13/2018   Orthostatic hypotension 05/06/2018   Near syncope 04/29/2018   Enlarged heart 05/14/2017   BPH associated with nocturia 10/30/2016   Need for vaccination 10/30/2016   Other acne 10/30/2016   Incomplete bladder emptying 04/03/2016   H/O urinary retention 07/04/2015   Elevated PSA 07/04/2015   Benign essential hypertension 02/27/2011   Diastasis of muscle 02/27/2011   Gout, unspecified 02/27/2011   Mixed hyperlipidemia 02/27/2011   Other psoriasis 02/27/2011   Prostatitis 02/27/2011   Shoulder joint pain 02/27/2011   Rosacea 02/27/2011    Past Surgical History:  Procedure Laterality Date   COLONOSCOPY WITH PROPOFOL     COLONOSCOPY WITH PROPOFOL  N/A 10/07/2018   Procedure: COLONOSCOPY WITH PROPOFOL;  Surgeon: Toledo, Boykin Nearing, MD;  Location: [facility];  Service: Gastroenterology;  Laterality: N/A;   ESOPHAGOGASTRODUODENOSCOPY (EGD) WITH PROPOFOL N/A 10/07/2018   Procedure: ESOPHAGOGASTRODUODENOSCOPY (EGD) WITH PROPOFOL;  Surgeon: Toledo, Boykin Nearing, MD;  Location: [facility];  Service: Gastroenterology;  Laterality: N/A;   hydrostent removal         Home Medications    Prior to Admission medications   Medication Sig Start Date End Date Taking? Authorizing Provider  atorvastatin (LIPITOR) 40 MG tablet Take 40 mg by mouth daily. 05/14/23  Yes [provider]  carvedilol (COREG) 12.5 MG tablet Take 12.5 mg by mouth 2 (two) times daily. 09/08/20  Yes [provider]  chlorthalidone (HYGROTON) 25 MG tablet Take 1 tablet by mouth daily. 10/10/22 10/10/23 Yes [provider]  citalopram (CELEXA) 20 MG tablet Take 20 mg by mouth daily. 10/10/22 10/10/23 Yes [provider]  finasteride (PROSCAR) 5 MG tablet Take 1 tablet (5 mg total) by mouth daily. 07/01/23  Yes McGowan, Carollee Herter A, PA-C  montelukast (SINGULAIR) 10 MG tablet Take 1 tablet by mouth at bedtime. 06/12/23  Yes [provider]  tamsulosin (FLOMAX) 0.4 MG CAPS capsule Take 0.8 mg by mouth daily. 05/14/23 05/13/24 Yes [provider]  amLODipine (NORVASC)  5 MG tablet Take 1 tablet by mouth 2 (two) times daily. 12/21/19 06/16/23  [provider]  lisinopril (ZESTRIL) 30 MG tablet Take 1 tablet by mouth 2 (two) times daily. 12/21/19 06/16/23  [provider]    Family History Family History  Problem Relation Age of Onset   COPD Mother    Heart disease Father    Kidney disease Neg Hx    Prostate cancer Neg Hx    Kidney cancer Neg Hx    Bladder Cancer Neg Hx     Social History Social History   Tobacco Use   Smoking status: Never   Smokeless tobacco: Former    Types: Chew    Quit date: 1993  Vaping Use    Vaping status: Never Used  Substance Use Topics   Alcohol use: No    Alcohol/week: 0.0 standard drinks of alcohol   Drug use: No     Allergies   Patient has no known allergies.   Review of Systems Review of Systems  Genitourinary:  Positive for frequency.     Physical Exam Triage Vital Signs ED Triage Vitals [07/22/23 1002]  Encounter Vitals Group     BP (!) 143/66     Systolic BP Percentile      Diastolic BP Percentile      Pulse Rate 83     Resp 18     Temp 98.2 F (36.8 C)     Temp Source Oral     SpO2 94 %     Weight      Height      Head Circumference      Peak Flow      Pain Score      Pain Loc      Pain Education      Exclude from Growth Chart    No data found.  Updated Vital Signs BP (!) 143/66 (BP Location: Left Arm)   Pulse 83   Temp 98.2 F (36.8 C) (Oral)   Resp 18   SpO2 94%   Visual Acuity Right Eye Distance:   Left Eye Distance:   Bilateral Distance:    Right Eye Near:   Left Eye Near:    Bilateral Near:     Physical Exam Alert awake oriented times person no acute distress.  Abdomen is soft.  Suprapubic fullness present.  UC Treatments / Results  Labs (all labs ordered are listed, but only abnormal results are displayed) Labs Reviewed  URINALYSIS, W/ REFLEX TO CULTURE (INFECTION SUSPECTED) - Abnormal; Notable for the following components:      Result Value   Protein, ur 30 (*)    Leukocytes,Ua TRACE (*)    Bacteria, UA FEW (*)    All other components within normal limits    EKG   Radiology No results found.  Procedures Procedures (including critical care time)  Medications Ordered in UC Medications - No data to display  Initial Impression / Assessment and Plan / UC Course  I have reviewed the triage vital signs and the nursing notes.  Pertinent labs & imaging results that were available during my care of the patient were reviewed by me and considered in my medical decision making (see chart for details). I  discussed the case with Roxan Hockey with patient's urology APP.  She is going to see patient today in her office.  I would have considered catheter placement but we do not have the privilege here.  If patient was not seen by  urology today would have sent patient to the emergency room for Foley catheterization until patient could have cystoscopy.  However urology office is going to see patient today.  I sent patient to urology office right from here.   Final Clinical Impressions(s) / UC Diagnoses   Final diagnoses:  BPH with urinary obstruction   Discharge Instructions   None    ED Prescriptions   None    PDMP not reviewed this encounter.   Lura Em, MD 07/22/23 1058

## 2023-07-22 NOTE — ED Triage Notes (Signed)
Enlarged prostate  Patient states that he can't empty his bladder due to his enlarged prostate.   Wife states that in the past month he's been to the [facility] and his urologists. Patient states that he usually has to be cath to empty bladder.   Sx x 5 days. Unable to empty bladder.

## 2023-07-26 ENCOUNTER — Other Ambulatory Visit: Payer: Self-pay

## 2023-07-26 ENCOUNTER — Telehealth: Payer: Self-pay

## 2023-07-26 ENCOUNTER — Ambulatory Visit: Payer: Medicare Other | Admitting: Urology

## 2023-07-26 ENCOUNTER — Other Ambulatory Visit
Admission: RE | Admit: 2023-07-26 | Discharge: 2023-07-26 | Disposition: A | Discharge status: Home / Self Care | Payer: Medicare Other | Attending: Urology | Admitting: Urology

## 2023-07-26 VITALS — BP 100/56 | HR 84 | Ht 69.0 in | Wt 228.0 lb

## 2023-07-26 DIAGNOSIS — N401 Enlarged prostate with lower urinary tract symptoms: Secondary | ICD-10-CM

## 2023-07-26 DIAGNOSIS — N138 Other obstructive and reflux uropathy: Secondary | ICD-10-CM | POA: Diagnosis not present

## 2023-07-26 DIAGNOSIS — Z01818 Encounter for other preprocedural examination: Secondary | ICD-10-CM

## 2023-07-26 LAB — URINALYSIS, COMPLETE (UACMP) WITH MICROSCOPIC
Bilirubin Urine: NEGATIVE
Glucose, UA: NEGATIVE mg/dL
Hgb urine dipstick: NEGATIVE
Ketones, ur: NEGATIVE mg/dL
Leukocytes,Ua: NEGATIVE
Nitrite: NEGATIVE
Protein, ur: 30 mg/dL — AB
Specific Gravity, Urine: 1.025 (ref 1.005–1.030)
pH: 5.5 (ref 5.0–8.0)

## 2023-07-26 MED ORDER — LIDOCAINE HCL URETHRAL/MUCOSAL 2 % EX GEL
1.0000 | Freq: Once | CUTANEOUS | Status: AC
  Administered 2023-07-26: 1 via URETHRAL

## 2023-07-26 NOTE — Progress Notes (Signed)
   [facility] Posting Form  Surgery Date: Date: 08/26/2023  Surgeon: Dr. Vanna Scotland, MD  Inpt ( No  )   Outpt (Yes)   Obs ( No  )   Diagnosis: N40.1, N13.8 Benign Prostatic Hyperplasia with Urinary Obstruction  -CPT: [id number]  Surgery: Holmium Laser Enucleation of the Prostate  Stop Anticoagulations: Yes and also hold ASA  Cardiac/Medical/Pulmonary Clearance needed: no  *Orders entered into EPIC  Date: 07/26/23   *Case booked in [place]  Date: 07/26/23  *Notified pt of Surgery: Date: 07/26/23  PRE-OP UA & CX: No  *Placed into Prior Authorization Work Angela Nevin Date: 07/26/23  Assistant/laser/rep:No

## 2023-07-26 NOTE — Progress Notes (Signed)
Surgical Physician Order Form [facility]  Dr. Vanna Scotland, MD  * Scheduling expectation : Next Available  *Length of Case:   *Clearance needed: no  *Anticoagulation Instructions: Hold all anticoagulants  *Aspirin Instructions: Hold Aspirin  *Post-op visit Date/Instructions: 2 days catheter removal, 6 weeks IPSS PVR  *Diagnosis: BPH w/urinary obstruction  *Procedure:  HOLEP (40981)   Additional orders: N/A  -Admit type: OUTpatient  -Anesthesia: General  -VTE Prophylaxis Standing Order SCD's       Other:   -Standing Lab Orders Per Anesthesia    Lab other: None  -Standing Test orders EKG/Chest x-ray per Anesthesia       Test other:   - Medications:  Ancef 2gm IV  -Other orders:  N/A

## 2023-07-26 NOTE — Progress Notes (Signed)
   07/26/23  CC:  Chief Complaint  Patient presents with   Cysto    HPI: 77 year old male who presents today for further evaluation of ongoing lower urinary tract symptoms/BPH.  Notably, he is an enlarged prostate, estimated 140 cc based on CT scan.  He has been managed on 0.8 mg of Flomax and finasteride and continues to have urinary symptoms despite this.  He presents today for cystoscopic evaluation.  There were no vitals taken for this visit. NED. A&Ox3.   No respiratory distress   Abd soft, NT, ND Normal phallus with bilateral descended testicles  Cystoscopy Procedure Note  Patient identification was confirmed, informed consent was obtained, and patient was prepped using Betadine solution.  Lidocaine jelly was administered per urethral meatus.     Pre-Procedure: - Inspection reveals a normal caliber ureteral meatus.  Procedure: The flexible cystoscope was introduced without difficulty - No urethral strictures/lesions are present. - Enlarged prostate bilobar coaptation; friable prostatic mucosa - Elevated bladder neck - Bilateral ureteral orifices identified - Bladder mucosa  reveals no ulcers, tumors, or lesions - No bladder stones -Mild to moderate with saccules   Retroflexion shows slight intravesical protrusion of the lateral lobes but no discrete median lobe component   Post-Procedure: - Patient tolerated the procedure well  Assessment/ Plan:  1. BPH with obstruction/lower urinary tract symptoms (Primary) Refractory poorly controlled symptoms secondary to massive BPH, 140 g prostate is failed medical management  Given the size of his prostate, would most strongly recommend proceeding with HoLEP.  We discussed the common postoperative course following holep including need for overnight Foley catheter, temporary worsening of irritative voiding symptoms, and occasional stress incontinence which typically lasts up to 6 months but can persist.  We discussed  retrograde ejaculation and damage to surrounding structures including the urinary sphincter. Other uncommon complications including hematuria and urinary tract infection.  He understands all of the above and is willing to proceed as planned.  - lidocaine (XYLOCAINE) 2 % jelly 1 Application - Urine Culture; Future  2. Pre-op testing - Urine Culture; Future    Vanna Scotland, MD

## 2023-07-26 NOTE — Patient Instructions (Signed)

## 2023-07-26 NOTE — Telephone Encounter (Signed)
Per Dr. Apolinar Junes, Patient is to be scheduled for Holmium Laser Enucleation of the Prostate   Mr. Eric Dickson was contacted and possible surgical dates were discussed, Monday January 20th, 2025 was agreed upon for surgery.   Patient was directed to call [phone] between 1-3pm the day before surgery to find out surgical arrival time.  Instructions were given not to eat or drink from midnight on the night before surgery and have a driver for the day of surgery. On the surgery day patient was instructed to enter through the Medical Mall entrance of [facility] report the Same Day Surgery desk.   Pre-Admit Testing will be in contact via phone to set up an interview with the anesthesia team to review your history and medications prior to surgery.   Reminder of this information was sent via MyChart to the patient.

## 2023-08-11 ENCOUNTER — Encounter: Payer: Self-pay | Admitting: Urgent Care

## 2023-08-11 ENCOUNTER — Encounter: Payer: Self-pay | Admitting: Anesthesiology

## 2023-08-19 ENCOUNTER — Other Ambulatory Visit: Payer: Self-pay

## 2023-08-19 ENCOUNTER — Encounter
Admission: RE | Admit: 2023-08-19 | Discharge: 2023-08-19 | Disposition: A | Discharge status: Home / Self Care | Payer: Medicare Other | Source: Ambulatory Visit | Attending: Urology | Admitting: Urology

## 2023-08-19 VITALS — Ht 69.0 in | Wt 225.0 lb

## 2023-08-19 DIAGNOSIS — E782 Mixed hyperlipidemia: Secondary | ICD-10-CM

## 2023-08-19 DIAGNOSIS — Z01810 Encounter for preprocedural cardiovascular examination: Secondary | ICD-10-CM

## 2023-08-19 DIAGNOSIS — I10 Essential (primary) hypertension: Secondary | ICD-10-CM

## 2023-08-19 DIAGNOSIS — Z01812 Encounter for preprocedural laboratory examination: Secondary | ICD-10-CM

## 2023-08-19 HISTORY — DX: Essential (primary) hypertension: I10

## 2023-08-19 HISTORY — DX: Elevated prostate specific antigen (PSA): R97.20

## 2023-08-19 HISTORY — DX: Other obstructive and reflux uropathy: N13.8

## 2023-08-19 HISTORY — DX: Ventricular premature depolarization: I49.3

## 2023-08-19 HISTORY — DX: Orthostatic hypotension: I95.1

## 2023-08-19 HISTORY — DX: Cardiomegaly: I51.7

## 2023-08-19 HISTORY — DX: Mixed hyperlipidemia: E78.2

## 2023-08-19 NOTE — Patient Instructions (Addendum)
 Your procedure is scheduled on: Monday, January 20 Report to the Registration Desk on the 1st floor of the [facility] To find out your arrival time, please call [phone] between 1PM - 3PM on: Friday, January 17 If your arrival time is 6:00 am, do not arrive before that time as the Medical Mall entrance doors do not open until 6:00 am.  REMEMBER: Instructions that are not followed completely may result in serious medical risk, up to and including death; or upon the discretion of your surgeon and anesthesiologist your surgery may need to be rescheduled.  Do not eat or drink after midnight the night before surgery.  No gum chewing or hard candies.  One week prior to surgery: starting January 13 Stop Anti-inflammatories (NSAIDS) such as Advil, Aleve, Ibuprofen, Motrin, Naproxen, Naprosyn and Aspirin  based products such as Excedrin, Goody's Powder, BC Powder. Stop ANY OVER THE COUNTER supplements until after surgery. Stop Probiotic.  You may however, continue to take Tylenol  if needed for pain up until the day of surgery.  Continue taking all of your other prescription medications up until the day of surgery.  ON THE DAY OF SURGERY ONLY TAKE THESE MEDICATIONS WITH SIPS OF WATER:  Amlodipine  Atorvastatin  (Lipitor) Carvedilol Citalopram (Celexa) Finasteride  (Proscar ) Pantoprazole  (Protonix ) Tamsulosin  (Flomax )  No Alcohol for 24 hours before or after surgery.  No Smoking including e-cigarettes for 24 hours before surgery.  No chewable tobacco products for at least 6 hours before surgery.  No nicotine patches on the day of surgery.  Do not use any recreational drugs for at least a week (preferably 2 weeks) before your surgery.  Please be advised that the combination of cocaine and anesthesia may have negative outcomes, up to and including death. If you test positive for cocaine, your surgery will be cancelled.  On the morning of surgery brush your teeth with toothpaste and  water, you may rinse your mouth with mouthwash if you wish. Do not swallow any toothpaste or mouthwash.  Do not wear jewelry, make-up, hairpins, clips or nail polish.  For welded (permanent) jewelry: bracelets, anklets, waist bands, etc.  Please have this removed prior to surgery.  If it is not removed, there is a chance that hospital personnel will need to cut it off on the day of surgery.  Do not wear lotions, powders, or perfumes.   Do not shave body hair from the neck down 48 hours before surgery.  Contact lenses, hearing aids and dentures may not be worn into surgery.  Do not bring valuables to the hospital. [facility] is not responsible for any missing/lost belongings or valuables.   Notify your doctor if there is any change in your medical condition (cold, fever, infection).  Wear comfortable clothing (specific to your surgery type) to the hospital.  After surgery, you can help prevent lung complications by doing breathing exercises.  Take deep breaths and cough every 1-2 hours.   If you are being discharged the day of surgery, you will not be allowed to drive home. You will need a responsible individual to drive you home and stay with you for 24 hours after surgery.   If you are taking public transportation, you will need to have a responsible individual with you.  Please call the Pre-admissions Testing Dept. at [phone] if you have any questions about these instructions.  Surgery Visitation Policy:  Patients having surgery or a procedure may have two visitors.  Children under the age of 91 must have an adult  with them who is not the patient.  Temporary Visitor Restrictions Due to increasing cases of flu, RSV and COVID-19: Children ages 12 and under will not be able to visit patients in [facility] hospitals under most circumstances.

## 2023-08-20 ENCOUNTER — Encounter
Admission: RE | Admit: 2023-08-20 | Discharge: 2023-08-20 | Disposition: A | Discharge status: Home / Self Care | Payer: Medicare Other | Source: Ambulatory Visit | Attending: Urology | Admitting: Urology

## 2023-08-20 DIAGNOSIS — E785 Hyperlipidemia, unspecified: Secondary | ICD-10-CM | POA: Insufficient documentation

## 2023-08-20 DIAGNOSIS — Z01818 Encounter for other preprocedural examination: Secondary | ICD-10-CM | POA: Diagnosis present

## 2023-08-20 DIAGNOSIS — I10 Essential (primary) hypertension: Secondary | ICD-10-CM | POA: Insufficient documentation

## 2023-08-20 DIAGNOSIS — Z01812 Encounter for preprocedural laboratory examination: Secondary | ICD-10-CM

## 2023-08-20 DIAGNOSIS — E782 Mixed hyperlipidemia: Secondary | ICD-10-CM

## 2023-08-20 DIAGNOSIS — Z01810 Encounter for preprocedural cardiovascular examination: Secondary | ICD-10-CM | POA: Diagnosis not present

## 2023-08-20 LAB — BASIC METABOLIC PANEL
Anion gap: 12 (ref 5–15)
BUN: 18 mg/dL (ref 8–23)
CO2: 25 mmol/L (ref 22–32)
Calcium: 8.7 mg/dL — ABNORMAL LOW (ref 8.9–10.3)
Chloride: 99 mmol/L (ref 98–111)
Creatinine, Ser: 0.98 mg/dL (ref 0.61–1.24)
GFR, Estimated: >60 mL/min (ref >60)
Glucose, Bld: 174 mg/dL — ABNORMAL HIGH (ref 70–99)
Potassium: 3.7 mmol/L (ref 3.5–5.1)
Sodium: 136 mmol/L (ref 135–145)

## 2023-08-25 MED ORDER — CHLORHEXIDINE GLUCONATE 0.12 % MT SOLN
15.0000 mL | Freq: Once | OROMUCOSAL | Status: AC
  Administered 2023-08-26: 15 mL via OROMUCOSAL

## 2023-08-25 MED ORDER — CEFAZOLIN SODIUM-DEXTROSE 2-4 GM/100ML-% IV SOLN: 2.0000 g | INTRAVENOUS | Status: DC

## 2023-08-25 MED ORDER — LACTATED RINGERS IV SOLN: INTRAVENOUS | Status: DC

## 2023-08-25 MED ORDER — ORAL CARE MOUTH RINSE: 15.0000 mL | Freq: Once | OROMUCOSAL | Status: AC

## 2023-08-26 ENCOUNTER — Ambulatory Visit
Admission: RE | Admit: 2023-08-26 | Discharge: 2023-08-26 | Disposition: A | Discharge status: Home / Self Care | Payer: Medicare Other | Attending: Urology | Admitting: Urology

## 2023-08-26 ENCOUNTER — Encounter: Payer: Self-pay | Admitting: Urology

## 2023-08-26 ENCOUNTER — Other Ambulatory Visit: Payer: Self-pay

## 2023-08-26 ENCOUNTER — Encounter
Admission: RE | Disposition: A | Discharge status: Home / Self Care | Payer: Self-pay | Source: Home / Self Care | Attending: Urology

## 2023-08-26 DIAGNOSIS — Z5329 Procedure and treatment not carried out because of patient's decision for other reasons: Secondary | ICD-10-CM | POA: Insufficient documentation

## 2023-08-26 DIAGNOSIS — N401 Enlarged prostate with lower urinary tract symptoms: Secondary | ICD-10-CM | POA: Insufficient documentation

## 2023-08-26 DIAGNOSIS — N139 Obstructive and reflux uropathy, unspecified: Secondary | ICD-10-CM | POA: Diagnosis not present

## 2023-08-26 SURGERY — HOLEP-LASER ENUCLEATION OF THE PROSTATE WITH MORCELLATION
Anesthesia: General

## 2023-08-26 MED ORDER — FENTANYL CITRATE (PF) 100 MCG/2ML IJ SOLN
INTRAMUSCULAR | Status: AC
  Filled 2023-08-26: qty 2

## 2023-08-26 MED ORDER — CEFAZOLIN SODIUM-DEXTROSE 2-4 GM/100ML-% IV SOLN
INTRAVENOUS | Status: AC
  Filled 2023-08-26: qty 100

## 2023-08-26 MED ORDER — CHLORHEXIDINE GLUCONATE 0.12 % MT SOLN
OROMUCOSAL | Status: AC
  Filled 2023-08-26: qty 15

## 2023-08-26 NOTE — H&P (Signed)
Patient presented on the day of surgery and after discussion in the preoperative holding area, elected not to proceed with surgery.  He notes that he believes that the finasteride that was prescribed several months ago has started to "kick in" and he is voiding now normally and is pleased with the symptoms.  Will have him follow-up in 6 months with Carollee Herter in [place] to reassess.  Vanna Scotland, MD

## 2023-08-26 NOTE — Anesthesia Preprocedure Evaluation (Signed)
Anesthesia Evaluation  Patient identified by MRN, date of birth, ID band Patient awake    Reviewed: Allergy & Precautions, NPO status , Patient's Chart, lab work & pertinent test results  History of Anesthesia Complications Negative for: history of anesthetic complications  Airway Mallampati: III  TM Distance: >3 FB Neck ROM: full    Dental no notable dental hx.    Pulmonary neg pulmonary ROS   Pulmonary exam normal        Cardiovascular hypertension, On Medications negative cardio ROS Normal cardiovascular exam  EKG 08/2023: Sinus rhythm with 1st degree A-V block with occasional Premature ventricular complexes   Neuro/Psych negative neurological ROS  negative psych ROS   GI/Hepatic negative GI ROS, Neg liver ROS,,,  Endo/Other  negative endocrine ROS    Renal/GU      Musculoskeletal   Abdominal   Peds  Hematology negative hematology ROS (+)   Anesthesia Other Findings Past Medical History: No date: Benign prostatic hyperplasia with urinary obstruction No date: Diastasis, muscle No date: Elevated PSA No date: Enlarged heart No date: Essential hypertension No date: Frequent PVCs No date: Gout No date: Microscopic hematuria No date: Mixed hyperlipidemia No date: Obesity No date: Orthostatic hypotension No date: Prostatitis No date: Psoriasis No date: Renal mass     Comment:  Dromedary hump No date: Rosacea No date: Shoulder pain No date: Urinary retention  Past Surgical History: 2015: COLONOSCOPY WITH PROPOFOL 10/07/2018: COLONOSCOPY WITH PROPOFOL; N/A     Comment:  Procedure: COLONOSCOPY WITH PROPOFOL;  Surgeon: Toledo,               Boykin Nearing, MD;  Location: [facility];  Service:               Gastroenterology;  Laterality: N/A; 10/07/2018: ESOPHAGOGASTRODUODENOSCOPY (EGD) WITH PROPOFOL; N/A     Comment:  Procedure: ESOPHAGOGASTRODUODENOSCOPY (EGD) WITH               PROPOFOL;  Surgeon:  Toledo, Boykin Nearing, MD;  Location:               [facility];  Service: Gastroenterology;  Laterality:               N/A; No date: HYDROCELE EXCISION  BMI    Body Mass Index: 33.23 kg/m      Reproductive/Obstetrics negative OB ROS                             Anesthesia Physical Anesthesia Plan  ASA: 3  Anesthesia Plan: General ETT   Post-op Pain Management: Ofirmev IV (intra-op)* and Toradol IV (intra-op)*   Induction: Intravenous  PONV Risk Score and Plan: 2 and Ondansetron, Dexamethasone and Treatment may vary due to age or medical condition  Airway Management Planned: Oral ETT  Additional Equipment:   Intra-op Plan:   Post-operative Plan: Extubation in OR  Informed Consent: I have reviewed the patients History and Physical, chart, labs and discussed the procedure including the risks, benefits and alternatives for the proposed anesthesia with the patient or authorized representative who has indicated his/her understanding and acceptance.     Dental Advisory Given  Plan Discussed with: Anesthesiologist, CRNA and Surgeon  Anesthesia Plan Comments: (Patient consented for risks of anesthesia including but not limited to:  - adverse reactions to medications - damage to eyes, teeth, lips or other oral mucosa - nerve damage due to positioning  - sore throat or hoarseness - Damage to  heart, brain, nerves, lungs, other parts of body or loss of life  Patient voiced understanding and assent.)       Anesthesia Quick Evaluation

## 2023-08-28 ENCOUNTER — Encounter: Payer: Medicare Other | Admitting: Urology

## 2023-09-26 ENCOUNTER — Ambulatory Visit: Admit: 2023-09-26 | Payer: Medicare Other | Admitting: Gastroenterology

## 2023-09-26 SURGERY — COLONOSCOPY WITH PROPOFOL
Anesthesia: General

## 2023-10-18 ENCOUNTER — Ambulatory Visit: Payer: Medicare Other | Admitting: Urology

## 2024-02-06 NOTE — Progress Notes (Signed)
 Chief Complaint  Patient presents with  . Diabetes    Subjective  Eric is a 78 y.o. male who presents for Diabetes HPI History of Present Illness Eric Dickson is a 78 year old male with diabetes who presents for a follow-up visit.  Since his last visit, he has undergone cataract surgery on both eyes and now has significantly improved vision, requiring reading glasses only for very small print. His optometrist confirmed this improvement at a follow-up appointment yesterday.  He has not consumed any sugar or sweets since his last visit and monitors his blood glucose levels, noting fluctuations, particularly postprandially. He checks his glucose levels two hours after meals, with a recent reading of 182 mg/dL after lunch. Lab Results  Component Value Date   HGBA1C 7.4 (H) 10/28/2023   Lab Results  Component Value Date   LDLCALC 65 10/28/2023   CREATININE 1.2 10/28/2023    He has incorporated more fruit into his diet, consuming blueberries, peaches, and cantaloupe at least three times a day, while being mindful of portion sizes.  No significant issues with reflux. His cholesterol levels have been stable, with an LDL of 65 mg/dL noted in March. No results found for: CHOL Lab Results  Component Value Date   HDL 40 10/28/2023   HDL 36 10/10/2022   HDL 39 10/17/2021   Lab Results  Component Value Date   LDLCALC 65 10/28/2023   LDLCALC 66 10/10/2022   LDLCALC 54 10/17/2021   Lab Results  Component Value Date   TRIG 95 10/28/2023   TRIG 113 10/10/2022   TRIG 120 10/17/2021    He experiences anxiety at times, particularly when visiting the clinic or during his first eye surgery, for which he was given medication. He notes slight swelling in one leg but does not consider it significant.  Review of Systems  Patient Active Problem List  Diagnosis  . Essential hypertension, benign  . Gout, unspecified  . Hypertrophy of prostate without urinary obstruction and other lower  urinary tract symptoms (LUTS)  . Prostatitis, unspecified  . Pain in joint, shoulder region  . Mixed hyperlipidemia  . Rosacea  . Diastasis of muscle  . Other psoriasis  . Renal mass  . Right shoulder pain  . Other acne  . BPH associated with nocturia  . Need for vaccination  . Enlarged heart  . Elevated PSA  . H/O urinary retention  . Incomplete bladder emptying  . Orthostatic hypotension  . Frequent PVCs  . Precordial pain  . Elevated troponin I level  . Type 2 diabetes mellitus without complication, without long-term current use of insulin (CMS/HHS-HCC)  . Combined forms of age-related cataract of both eyes  . Diabetes type 2, no ocular involvement (CMS/HHS-HCC)  . PVD (posterior vitreous detachment), bilateral    Outpatient Medications Prior to Visit  Medication Sig Dispense Refill  . albuterol MDI, PROVENTIL, VENTOLIN, PROAIR, HFA 90 mcg/actuation inhaler Inhale 2 inhalations into the lungs every 4 (four) hours as needed for Wheezing or Shortness of Breath 1 each 11  . amLODIPine  (NORVASC ) 5 MG tablet Take 1 tablet (5 mg total) by mouth 2 (two) times daily 200 tablet 1  . aspirin  81 MG EC tablet Take 81 mg by mouth once daily    . atorvastatin  (LIPITOR) 40 MG tablet Take 1 tablet (40 mg total) by mouth once daily 100 tablet 3  . blood glucose diagnostic test strip 1 each (1 strip total) once daily Use as instructed. 90 each 3  .  blood glucose meter kit as directed 1 each 0  . carvediloL (COREG) 12.5 MG tablet Take 1 tablet (12.5 mg total) by mouth once daily 100 tablet 2  . chlorthalidone 25 MG tablet Take 1 tablet (25 mg total) by mouth once daily 90 tablet 3  . citalopram (CELEXA) 20 MG tablet Take 1 tablet (20 mg total) by mouth once daily 90 tablet 3  . clindamycin (CLEOCIN T) 1 % topical gel APPLY TOPICALLY CONTINUOUSLY AS NEEDED 60 g 2  . finasteride  (PROSCAR ) 5 mg tablet Take 1 tablet (5 mg total) by mouth once daily 90 tablet 3  . fluticasone propionate (FLONASE)  50 mcg/actuation nasal spray Place 1 spray into both nostrils once daily as needed 16 g 6  . hyoscyamine (LEVSIN/SL) 0.125 mg SL tablet Place 1 tablet (0.125 mg total) under the tongue every 6 (six) hours as needed for Cramping 30 tablet 1  . lancets Use 1 each once daily Use as instructed. 90 each 3  . lisinopriL  (ZESTRIL ) 30 MG tablet Take 1 tablet (30 mg total) by mouth 2 (two) times daily 200 tablet 3  . metFORMIN (GLUCOPHAGE-XR) 750 MG XR tablet Take 1 tablet (750 mg total) by mouth daily with dinner 90 tablet 3  . montelukast (SINGULAIR) 10 mg tablet Take 1 tablet (10 mg total) by mouth at bedtime 100 tablet 2  . pantoprazole  (PROTONIX ) 40 MG DR tablet Take 1 tablet (40 mg total) by mouth once daily 30 tablet 3  . simethicone  (GAS-X ORAL) Take by mouth    . sodium, potassium, and magnesium  (SUPREP) oral solution Take 1 Bottle by mouth as directed One kit contains 2 bottles.  Take both bottles at the times instructed by your provider. 354 mL 0  . tamsulosin  (FLOMAX ) 0.4 mg capsule Take 2 capsules (0.8 mg total) by mouth once daily 180 capsule 3   No facility-administered medications prior to visit.      Objective  Vitals:   02/06/24 0958 02/06/24 1001  BP: (!) 161/70 137/68  Pulse: 73 73  SpO2: 97% 97%  Weight: (!) 106.6 kg (235 lb) (!) 106.6 kg (235 lb)  PainSc: 0-No pain    Body mass index is 34.7 kg/m.  Home Vitals:     Physical Exam Physical Exam VITALS: BP- 137/68 EXTREMITIES: Trace swelling in the leg.  Constitutional: alert, in NAD, and communicates well Eye exam: sclera anicteric. Neck: supple and no thyroid enlargement or cervical adenopathy Respiratory: clear to auscultation, without rales or wheezes  Cardiovascular: regular rate and rhythm and without murmurs, rubs or gallops Lower extremities: no lower extremity edema Neurological: sensorimotor grossly intact and normal muscle tone  Results LABS LDL: 65 mg/dL (96/7974)     Assessment/Plan:    Assessment & Plan Diabetes Mellitus He is monitoring blood glucose levels but was unaware of the need to check glucose two hours postprandially for accurate readings. Blood glucose levels fluctuate, with a post-lunch level of 182 mg/dL. He has abstained from sugar or sweets since the last visit. A1c will be checked to assess current control. Emphasis on managing diabetes to prevent complications. He is committed to proper diabetes management. - Educate on checking blood glucose two hours postprandially for accurate readings - Order A1c test - Continue current diabetes management plan - Adjust diabetes management plan if A1c indicates poor control  Hypertension Blood pressure is well-controlled at 137/68 mmHg. He is maintaining a healthy diet, incorporating more fruits.  Hyperlipidemia The most recent lipid profile shows to be  properly controlled. The patient is to continue with current regiment (see medications list) together with a low-fat diet, dietary fiber, exercise. Follow up in 3 months.  Situational anxiety Patient reports doing well he is able to cope with the anxiety with the use of citalopram.  He is tolerating it well.  He is to continue with the same dose. Follow-up in 4 months.  Cataracts (post-surgery) He has undergone successful cataract surgery in both eyes with laser technology. Vision is significantly improved, with the ability to read the smallest line on the eye chart. No need for regular prescription glasses, only occasional use of readers for very small print.  Gastroesophageal Reflux Disease (GERD) He reports no current issues with reflux.  General Health Maintenance Cholesterol levels are well-controlled with an LDL of 65 mg/dL as of March.  Follow-up He is typically seen every four months for diabetes management. - Schedule follow-up appointment in four months - Review A1c results and adjust management plan if necessary - Communicate A1c results through  MyChart  Diagnoses and all orders for this visit:  Type 2 diabetes mellitus without complication, without long-term current use of insulin (CMS/HHS-HCC) -     Albumin/Creatinine Ratio, Random Urine; Future -     Hemoglobin A1C  Essential hypertension -     Comprehensive Metabolic Panel (CMP)  Severe obesity (BMI 35.0-39.9) with comorbidity (CMS/HHS-HCC)  Hypercholesterolemia  Situational anxiety  GERD without esophagitis        This visit was coded based on medical decision making (MDM).           Future Appointments     Date/Time Provider Department Center Visit Type   06/08/2024 10:00 AM (Arrive by 9:45 AM) Eliverto Bette Hover, MD [facility] [facility] OFFICE VISIT   06/09/2024 9:15 AM Custovic, Annalee, DO [facility] [place] [facility] FOLLOW UP   10/27/2024 9:00 AM (Arrive by 8:45 AM) POP[facility] HEALTH NURSE - [facility]   10/27/2024 10:00 AM (Arrive by 9:45 AM) Eliverto Bette Hover, MD [facility] [place] OFFICE VISIT       There are no Patient Instructions on file for this visit.  An after visit summary was provided for the patient either in written format (printed) or through [facility].  This note has been created using automated tools and reviewed for accuracy by MARIO E OLMEDO.

## 2024-02-21 NOTE — Progress Notes (Signed)
 02/24/2024 10:37 AM   Eric Dickson. [birth year] [id number]  Referring provider: Eliverto Bette Hover, MD [address] [place],  [place] [address]  Urological history: 1. BPH with LU TS -PSA (2020) 0.82 -cysto (07/2023) enlarged prostate bilobar coaptation; friable prostatic mucosa, elevated bladder neck, mild to moderate with saccules with no discrete median lobe -CT  (07/2023) prostate volume 140 cc  -Tamsulosin  0.8 mg daily and finasteride  5 mg daily  2. Renal cysts -CT (06/2023) - Simple, benign bilateral renal cortical cysts  Chief Complaint  Patient presents with   Follow-up   HPI: Eric Dickson. is a 78 y.o. male who presents today for 6 month follow up.    Previous records reviewed.   In November of last year he underwent workup for BOO.  He was found to have massive BPH and obstructing lobes and was scheduled for HoLEP, but on the day of surgery he opted out stating the finasteride  had kicked in.    I PSS 8/2  He continues to be mostly satisfied with his urinary symptoms.  He states he gets up 1 time a night 50% of the time and he has a slower urinary stream at night, but it resolves in the morning.  Patient denies any modifying or aggravating factors.  Patient denies any recent UTI's, gross hematuria, dysuria or suprapubic/flank pain.  Patient denies any fevers, chills, nausea or vomiting.    Hemoglobin A1c (02/2024) 7.0  Serum creatinine (02/2024) 1.1, eGFR 69  PMH: Past Medical History:  Diagnosis Date   Benign prostatic hyperplasia with urinary obstruction    Diastasis, muscle    Elevated PSA    Enlarged heart    Essential hypertension    Frequent PVCs    Gout    Microscopic hematuria    Mixed hyperlipidemia    Obesity    Orthostatic hypotension    Prostatitis    Psoriasis    Renal mass    Dromedary hump   Rosacea    Shoulder pain    Urinary retention     Surgical History: Past Surgical History:  Procedure  Laterality Date   COLONOSCOPY WITH PROPOFOL   2015   COLONOSCOPY WITH PROPOFOL  N/A 10/07/2018   Procedure: COLONOSCOPY WITH PROPOFOL ;  Surgeon: Toledo, Ladell POUR, MD;  Location: [facility] ENDOSCOPY;  Service: Gastroenterology;  Laterality: N/A;   ESOPHAGOGASTRODUODENOSCOPY (EGD) WITH PROPOFOL  N/A 10/07/2018   Procedure: ESOPHAGOGASTRODUODENOSCOPY (EGD) WITH PROPOFOL ;  Surgeon: Toledo, Ladell POUR, MD;  Location: [facility] ENDOSCOPY;  Service: Gastroenterology;  Laterality: N/A;   HYDROCELE EXCISION      Home Medications:  Allergies as of 02/24/2024   No Known Allergies      Medication List        Accurate as of February 24, 2024 10:37 AM. If you have any questions, ask your nurse or doctor.          STOP taking these medications    Na Sulfate-K Sulfate-Mg Sulfate concentrate 17.5-3.13-1.6 GM/177ML Soln Commonly known as: SUPREP       TAKE these medications    amLODipine  5 MG tablet Commonly known as: NORVASC  Take 5 mg by mouth 2 (two) times daily.   atorvastatin  40 MG tablet Commonly known as: LIPITOR Take 40 mg by mouth daily.   carvedilol 12.5 MG tablet Commonly known as: COREG Take 12.5 mg by mouth daily.   chlorthalidone 25 MG tablet Commonly known as: HYGROTON Take 25 mg by mouth daily.   citalopram 20 MG tablet Commonly  known as: CELEXA Take 20 mg by mouth daily.   finasteride  5 MG tablet Commonly known as: Proscar  Take 1 tablet (5 mg total) by mouth daily.   fluticasone 50 MCG/ACT nasal spray Commonly known as: FLONASE Place 1 spray into both nostrils daily as needed for allergies or rhinitis.   lisinopril  30 MG tablet Commonly known as: ZESTRIL  Take 30 mg by mouth 2 (two) times daily.   pantoprazole  40 MG tablet Commonly known as: PROTONIX  Take 40 mg by mouth daily.   PROBIOTIC DAILY PO Take 1 capsule by mouth daily.   tamsulosin  0.4 MG Caps capsule Commonly known as: FLOMAX  Take 0.8 mg by mouth daily.        Allergies: No Known  Allergies  Family History: Family History  Problem Relation Age of Onset   COPD Mother    Heart disease Father    Kidney disease Neg Hx    Prostate cancer Neg Hx    Kidney cancer Neg Hx    Bladder Cancer Neg Hx     Social History:  reports that he has never smoked. He quit smokeless tobacco use about 32 years ago.  His smokeless tobacco use included chew. He reports that he does not drink alcohol and does not use drugs.  ROS: Pertinent ROS in HPI  Physical Exam: BP (!) 161/70 (BP Location: Left Arm, Patient Position: Sitting, Cuff Size: Normal)   Pulse 73   Ht 5' 9 (1.753 m)   Wt 236 lb (107 kg)   SpO2 96%   BMI 34.85 kg/m   Constitutional:  Well nourished. Alert and oriented, No acute distress. HEENT: [place] AT, moist mucus membranes.  Trachea midline Cardiovascular: No clubbing, cyanosis, or edema. Respiratory: Normal respiratory effort, no increased work of breathing. Neurologic: Grossly intact, no focal deficits, moving all 4 extremities. Psychiatric: Normal mood and affect.   Laboratory Data: See HPI and EPIC  I have reviewed the labs.   Pertinent Imaging: N/A  Assessment & Plan:    1. BPH with LU TS - stable symptoms  - Aged out of screening - encouraged avoiding bladder irritants, fluid restriction before bedtime and timed voiding's - Continue tamsulosin  0.8 mg daily and finasteride  5 mg daily - educated on red flag symptoms: acute retention, gross hematuria, fever, severe pain - advised to call clinic or go to the ED if these occur - return to clinic in 6 months (per patient's request) for  symptom re-evaluation    Return in about 6 months (around 08/26/2024) for symptom recheck .  These notes generated with voice recognition software. I apologize for typographical errors.  CLOTILDA HELON RIGGERS  [facility] [address]  [address] [place], [place] [address] [phone]

## 2024-02-24 ENCOUNTER — Ambulatory Visit (INDEPENDENT_AMBULATORY_CARE_PROVIDER_SITE_OTHER): Payer: Self-pay | Admitting: Urology

## 2024-02-24 ENCOUNTER — Encounter: Payer: Self-pay | Admitting: Urology

## 2024-02-24 VITALS — BP 161/70 | HR 73 | Ht 69.0 in | Wt 236.0 lb

## 2024-02-24 DIAGNOSIS — N401 Enlarged prostate with lower urinary tract symptoms: Secondary | ICD-10-CM | POA: Diagnosis not present

## 2024-02-24 DIAGNOSIS — N138 Other obstructive and reflux uropathy: Secondary | ICD-10-CM | POA: Diagnosis not present

## 2024-02-24 MED ORDER — FINASTERIDE 5 MG PO TABS: 5.0000 mg | ORAL_TABLET | Freq: Every day | ORAL | 3 refills | Status: AC

## 2024-03-18 ENCOUNTER — Other Ambulatory Visit: Payer: Self-pay | Admitting: Medical Genetics

## 2024-06-25 ENCOUNTER — Encounter: Payer: Self-pay | Admitting: Urology

## 2024-08-13 NOTE — Progress Notes (Signed)
 " Chief Complaint  Patient presents with   Back Pain    Subjective  Eric is a 79 y.o. 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 The pain is triggered by lifting heavy objects and has recently been exacerbated by performing planks. He describes the pain as acute, stating that it 'hits me just like that' when he turns a certain way. The pain has been gradually improving with the use of extra strength Tylenol , which he takes as needed.  He is concerned about his kidney function, noting a previous test showed a low reading, which he could not specify. He has since increased his fluid intake and reports that his recent urine analysis was normal, with no signs of dehydration or other abnormalities.  He mentions having undergone a stress test, which revealed a small blockage suggested by the echocardiogram showing hypokinesis at the base of the heart. He reports being prescribed isosorbide, a nitrate medication, after his stress test. He reports never having had a heart attack and has a follow-up appointment scheduled at the end of the month.  He is interested in weight loss medications, noting that he has diabetes and is currently taking metformin. He is exploring options for weight management, including the potential use of medications like Ozempic or Mounjaro, which are also used for diabetes management.  Review of Systems  Patient Active Problem List  Diagnosis   Essential hypertension, benign   Gout, unspecified   Hypertrophy of prostate without urinary obstruction and other lower urinary tract symptoms (LUTS)   Prostatitis, unspecified   Pain in joint, shoulder region   Mixed hyperlipidemia   Rosacea   Diastasis of  muscle   Other psoriasis   Renal mass   Right shoulder pain   Other acne   BPH associated with nocturia   Need for vaccination   Enlarged heart   Elevated PSA   H/O urinary retention   Incomplete bladder emptying   Orthostatic hypotension   Frequent PVCs   Precordial pain   Elevated troponin I level   Type 2 diabetes mellitus without complication, without long-term current use of insulin (CMS/HHS-HCC)   Combined forms of age-related cataract of both eyes   Diabetes type 2, no ocular involvement (CMS/HHS-HCC)   PVD (posterior vitreous detachment), bilateral    Outpatient Medications Prior to Visit  Medication Sig Dispense Refill   albuterol MDI, PROVENTIL, VENTOLIN, PROAIR, HFA 90 mcg/actuation inhaler Inhale 2 inhalations into the lungs every 4 (four) hours as needed for Wheezing or Shortness of Breath 1 each 11   amLODIPine  (NORVASC ) 5 MG tablet TAKE (1) TABLET BY MOUTH TWICE DAILY 180 tablet 2   aspirin  81 MG EC tablet Take 81 mg by mouth once daily     atorvastatin  (LIPITOR) 40 MG tablet Take 1 tablet (40 mg total) by mouth once daily 100 tablet 3   blood glucose diagnostic test strip 1 each (1 strip total) once daily Use as instructed. 90 each 3   blood glucose meter kit as directed 1 each 0   carvediloL (COREG) 12.5 MG tablet Take 1 tablet (12.5 mg total) by mouth once daily 100 tablet 2   chlorthalidone 25 MG tablet Take 1 tablet (25 mg total) by  mouth once daily 90 tablet 3   citalopram (CELEXA) 20 MG tablet Take 1 tablet (20 mg total) by mouth once daily 90 tablet 3   clindamycin (CLEOCIN T) 1 % topical gel APPLY TOPICALLY CONTINUOUSLY AS NEEDED 60 g 2   finasteride  (PROSCAR ) 5 mg tablet Take 1 tablet (5 mg total) by mouth once daily 90 tablet 3   fluticasone propionate (FLONASE) 50 mcg/actuation nasal spray Place 1 spray into both nostrils once daily as needed 16 g 6   hyoscyamine (LEVSIN/SL) 0.125 mg SL tablet Place 1 tablet (0.125 mg total)  under the tongue every 6 (six) hours as needed for Cramping 30 tablet 1   isosorbide mononitrate (IMDUR) 30 MG ER tablet Take 1 tablet (30 mg total) by mouth once daily 30 tablet 11   lancets Use 1 each once daily Use as instructed. 90 each 3   lisinopriL  (ZESTRIL ) 30 MG tablet Take 1 tablet (30 mg total) by mouth 2 (two) times daily 200 tablet 3   metFORMIN (GLUCOPHAGE-XR) 750 MG XR tablet Take 1 tablet (750 mg total) by mouth daily with dinner 90 tablet 3   montelukast (SINGULAIR) 10 mg tablet Take 1 tablet (10 mg total) by mouth at bedtime 100 tablet 2   pantoprazole  (PROTONIX ) 40 MG DR tablet Take 1 tablet (40 mg total) by mouth once daily 30 tablet 3   simethicone  (GAS-X ORAL) Take by mouth     sodium, potassium, and magnesium  (SUPREP) oral solution Take 1 Bottle by mouth as directed One kit contains 2 bottles.  Take both bottles at the times instructed by your provider. 354 mL 0   tamsulosin  (FLOMAX ) 0.4 mg capsule Take 2 capsules (0.8 mg total) by mouth once daily 180 capsule 3   No facility-administered medications prior to visit.      Objective  Vitals:   08/13/24 1532  BP: (!) 158/67  Pulse: 64  SpO2: 97%  Weight: (!) 106.1 kg (234 lb)  PainSc: 1   PainLoc: Back   Body mass index is 34.56 kg/m.  Home Vitals:     Physical Exam Physical Exam    Constitutional: alert, obese, in NAD, and communicates well Eye exam: sclera anicteric. Neck: supple, no thyroid enlargement or cervical adenopathy, and no bruits heard Respiratory: clear to auscultation, without rales or wheezes  Cardiovascular: regular rate and rhythm and without murmurs, rubs or gallops Lower extremities: no lower extremity edema Skin ankles/feet: warm, good capillary refill and no ulcerations or lesions noted Neurological: sensorimotor grossly intact and normal muscle tone  Results Labs Urinalysis (08/13/2024): Within normal limits; no leukocytes, nitrites, blood, ketones, glucose, protein,  or bilirubin detected  Diagnostic Cardiac stress test (07/2024): Basal inferior wall hypokinesis at rest; small coronary artery stenosis  Results for orders placed or performed in visit on 08/13/24  [facility] POC Urinalysis Chemical w/Option to Reflex Urine Culture  Result Value Ref Range   See Comment      This urinalysis did not meet the criteria for reflex to Culture, Urine.   Color Yellow Colorless, Straw, Yellow, Light Yellow, Dark Yellow   Clarity Clear Clear   Specific Gravity 1.025 1.005 - 1.030   pH, Urine 6.0 5.0 - 8.0   Protein, Urinalysis Negative Negative   Glucose, Urinalysis Negative Negative   Ketones, Urinalysis Negative Negative   Blood, Urinalysis Negative Negative   Nitrite, Urinalysis Negative Negative   Leukocytes, Urinalysis Negative Negative   Bilirubin, Urinalysis Negative Negative   Urobilinogen, Urinalysis 0.2 0.2 - 1.0  mg/dL   Narrative   POC TEST(S) ABOVE PERFORMED AT THE PATIENT CARE LOCATION AND OVERSEEN BY THE [facility] POCT PROGRAM.        Assessment/Plan:   Assessment & Plan Acute exacerbation of chronic low back pain Chronic low back pain exacerbated by physical activity, such as planking. Pain is improving gradually with extra strength Tylenol . No prior physical therapy for back pain. - Referred to physical therapy at [facility] for back pain management.  Type 2 diabetes mellitus Type 2 diabetes mellitus. Discussion about weight loss medications and insurance coverage. Ozempic prescribed for diabetes management, which may also aid in weight loss. Insurance coverage for [employer] confirmed, but not for weight loss medications. Cost of Ozempic discussed, with potential out-of-pocket expenses if not covered by insurance. - Prescribed Ozempic for diabetes management, with potential weight loss benefits.  Initiation dose of 0.25 mg was sent to his pharmacy. - Continue Metformin as prescribed. - Sent prescription to [facility] for cost  evaluation.  Coronary artery disease Recent stress test indicating basal inferior wall hypokinesis at rest. No history of myocardial infarction. Currently on isosorbide for coronary artery disease management. Discussion about potential catheterization procedure, possibly through the wrist, to assess coronary arteries. Risks of catheterization, including potential aneurysm formation, discussed. - Continue isosorbide as prescribed. - Follow up with cardiologist at the end of the month for further evaluation and potential catheterization. Diagnoses and all orders for this visit:  Acute low back pain, unspecified back pain laterality, unspecified whether sciatica present -     [facility] POC Urinalysis Chemical w/Option to Reflex Urine Culture; Future  Acute exacerbation of chronic low back pain -     Ambulatory Referral to Physical Therapy  Type 2 diabetes mellitus without complication, without long-term current use of insulin (CMS/HHS-HCC) -     semaglutide (OZEMPIC) 0.25 mg or 0.5 mg (2 mg/3 mL) pen injector; Inject 0.375 mLs (0.25 mg total) subcutaneously once a week for 30 days  Severe obesity (BMI 35.0-39.9) with comorbidity (CMS-HCC) As part of the visit and considering the current Body mass index is 34.56 kg/m. we discussed with the patient the importance of weight control including the reduction of portion sizes, decrease sweets, increase physical activity, etc    This visit was coded based on medical decision making (MDM).           Future Appointments     Date/Time Provider Department Center Visit Type   08/31/2024 11:00 AM (Arrive by 10:45 AM) Dewane Shiner, DO [facility] RETURN VISIT   10/27/2024 9:00 AM (Arrive by 8:45 AM) POPULATION HEALTH NURSE - [facility] WELLNESS   10/27/2024 10:00 AM (Arrive by 9:45 AM) Eliverto Bette Hover, MD [facility] OFFICE VISIT   12/08/2024 9:15 AM (Arrive by  9:00 AM) Custovic, Sabina, DO [facility] [place] [facility] FOLLOW UP       There are no Patient Instructions on file for this visit.  An after visit summary was provided for the patient either in written format (printed) or through [facility].  This note has been created using automated tools and reviewed for accuracy by MARIO E OLMEDO.  "

## 2024-08-23 NOTE — Progress Notes (Unsigned)
 "   08/24/2024 10:24 AM   Eric Dickson. 1945-11-16 [id number]  Referring provider: Eliverto Bette Hover, MD [address] [place],  [place] [address]  Urological history: 1. BPH with LU TS -PSA (2020) 0.82 -cysto (07/2023) enlarged prostate bilobar coaptation; friable prostatic mucosa, elevated bladder neck, mild to moderate with saccules with no discrete median lobe -CT  (07/2023) prostate volume 140 cc  -Tamsulosin  0.8 mg daily and finasteride  5 mg daily  2. Renal cysts -CT (06/2023) - Simple, benign bilateral renal cortical cysts  No chief complaint on file.  HPI: Eric Dickson. is a 79 y.o. male who presents today for 6 month follow up.    Previous records reviewed.   In November of 2025, he underwent workup for BOO.  He was found to have massive BPH and obstructing lobes and was scheduled for HoLEP, but on the day of surgery he opted out stating the finasteride  had kicked in.    I PSS ***  He reports sensation of incomplete bladder emptying,   urinary frequency,   urinary intermittency,   urinary urgency,   a weak urinary stream,   having to strain to void,   nocturia x ***,   leaking before being able to reach the restroom,   leaking with coughing,   leaking without awareness,   and post void dribbling.     He is wearing *** pads//depends  daily.    Patient denies any modifying or aggravating factors.  Patient denies any recent UTI's, gross hematuria, dysuria or suprapubic/flank pain.  Patient denies any fevers, chills, nausea or vomiting.  ***  He has a family history of PCa, colon cancer, ovarian cancer and/or breast cancer with ***.   He does not have a family history of PCa, colon cancer, ovarian cancer, and/or breast cancer .***     Hemoglobin A1c (06/2024) 6.9  Serum creatinine (06/2024) 1.0  PMH: Past Medical History:  Diagnosis Date   Benign prostatic hyperplasia with urinary obstruction    Diastasis, muscle     Elevated PSA    Enlarged heart    Essential hypertension    Frequent PVCs    Gout    Microscopic hematuria    Mixed hyperlipidemia    Obesity    Orthostatic hypotension    Prostatitis    Psoriasis    Renal mass    Dromedary hump   Rosacea    Shoulder pain    Urinary retention     Surgical History: Past Surgical History:  Procedure Laterality Date   COLONOSCOPY WITH PROPOFOL   2015   COLONOSCOPY WITH PROPOFOL  N/A 10/07/2018   Procedure: COLONOSCOPY WITH PROPOFOL ;  Surgeon: Toledo, Ladell POUR, MD;  Location: [facility] ENDOSCOPY;  Service: Gastroenterology;  Laterality: N/A;   ESOPHAGOGASTRODUODENOSCOPY (EGD) WITH PROPOFOL  N/A 10/07/2018   Procedure: ESOPHAGOGASTRODUODENOSCOPY (EGD) WITH PROPOFOL ;  Surgeon: Toledo, Ladell POUR, MD;  Location: [facility] ENDOSCOPY;  Service: Gastroenterology;  Laterality: N/A;   HYDROCELE EXCISION      Home Medications:  Allergies as of 08/24/2024   No Known Allergies      Medication List        Accurate as of August 23, 2024 10:24 AM. If you have any questions, ask your nurse or doctor.          amLODipine  5 MG tablet Commonly known as: NORVASC  Take 5 mg by mouth 2 (two) times daily.   atorvastatin  40 MG tablet Commonly known as: LIPITOR Take 40 mg by mouth daily.  carvedilol 12.5 MG tablet Commonly known as: COREG Take 12.5 mg by mouth daily.   chlorthalidone 25 MG tablet Commonly known as: HYGROTON Take 25 mg by mouth daily.   citalopram 20 MG tablet Commonly known as: CELEXA Take 20 mg by mouth daily.   finasteride  5 MG tablet Commonly known as: Proscar  Take 1 tablet (5 mg total) by mouth daily.   fluticasone 50 MCG/ACT nasal spray Commonly known as: FLONASE Place 1 spray into both nostrils daily as needed for allergies or rhinitis.   lisinopril  30 MG tablet Commonly known as: ZESTRIL  Take 30 mg by mouth 2 (two) times daily.   PROBIOTIC DAILY PO Take 1 capsule by mouth daily.        Allergies: No Known  Allergies  Family History: Family History  Problem Relation Age of Onset   COPD Mother    Heart disease Father    Kidney disease Neg Hx    Prostate cancer Neg Hx    Kidney cancer Neg Hx    Bladder Cancer Neg Hx     Social History:  reports that he has never smoked. He quit smokeless tobacco use about 33 years ago.  His smokeless tobacco use included chew. He reports that he does not drink alcohol and does not use drugs.  ROS: Pertinent ROS in HPI  Physical Exam: There were no vitals taken for this visit.  Constitutional:  Well nourished. Alert and oriented, No acute distress. HEENT: [place] AT, moist mucus membranes.  Trachea midline, no masses. Cardiovascular: No clubbing, cyanosis, or edema. Respiratory: Normal respiratory effort, no increased work of breathing. GI: Abdomen is soft, non tender, non distended, no abdominal masses. Liver and spleen not palpable.  No hernias appreciated.  Stool sample for occult testing is not indicated.   GU: No CVA tenderness.  No bladder fullness or masses.  Patient with circumcised/uncircumcised phallus. ***Foreskin easily retracted***  Urethral meatus is patent.  No penile discharge. No penile lesions or rashes. Scrotum without lesions, cysts, rashes and/or edema.  Testicles are located scrotally bilaterally. No masses are appreciated in the testicles. Left and right epididymis are normal. Rectal: Patient with  normal sphincter tone. Anus and perineum without scarring or rashes. No rectal masses are appreciated. Prostate is approximately *** grams, *** nodules are appreciated. Seminal vesicles are normal. Skin: No rashes, bruises or suspicious lesions. Lymph: No cervical or inguinal adenopathy. Neurologic: Grossly intact, no focal deficits, moving all 4 extremities. Psychiatric: Normal mood and affect.   Laboratory Data: See HPI and EPIC  I have reviewed the labs.   Pertinent Imaging: N/A  Assessment & Plan:    1. BPH with LU TS - mild,  moderate severe symptoms *** and he is *** - no signs of retention, infection or malignancy *** - PSA/DRE aged out of screening  - PVR < 300 cc *** - most bothersome symptoms are *** - encouraged avoiding bladder irritants, fluid restriction before bedtime and timed voiding's - Continue tamsulosin  0.8 mg daily and finasteride  5 mg daily***:refills given - Cannot tolerate medication or medication failure, schedule cystoscopy *** - educated on red flag symptoms: acute retention, gross hematuria, fever, severe pain - advised to call clinic or go to the ED if these occur - return to clinic in *** symptom re-evaluation ***     No follow-ups on file.  These notes generated with voice recognition software. I apologize for typographical errors.  Eric Dickson  [facility] [address]  [address] [place], [place] [address] [phone]  "

## 2024-08-24 ENCOUNTER — Ambulatory Visit: Admitting: Urology

## 2024-08-24 DIAGNOSIS — N138 Other obstructive and reflux uropathy: Secondary | ICD-10-CM

## 2024-09-08 ENCOUNTER — Ambulatory Visit: Admitting: Urology

## 2024-10-05 ENCOUNTER — Ambulatory Visit: Admitting: Urology
